# Patient Record
Sex: Female | Born: 1960 | Race: White | Hispanic: No | Marital: Married | Smoking: Former smoker
Health system: Southern US, Community
[De-identification: ages and names within clinical notes are randomized; demographics above are authoritative.]

## PROBLEM LIST (undated history)

## (undated) DIAGNOSIS — R7303 Prediabetes: Secondary | ICD-10-CM

## (undated) DIAGNOSIS — Z960 Presence of urogenital implants: Secondary | ICD-10-CM

## (undated) DIAGNOSIS — I8393 Asymptomatic varicose veins of bilateral lower extremities: Secondary | ICD-10-CM

## (undated) DIAGNOSIS — F411 Generalized anxiety disorder: Secondary | ICD-10-CM

## (undated) DIAGNOSIS — J439 Emphysema, unspecified: Secondary | ICD-10-CM

## (undated) DIAGNOSIS — N812 Incomplete uterovaginal prolapse: Secondary | ICD-10-CM

## (undated) DIAGNOSIS — Z860101 Personal history of adenomatous and serrated colon polyps: Secondary | ICD-10-CM

## (undated) DIAGNOSIS — N393 Stress incontinence (female) (male): Secondary | ICD-10-CM

## (undated) DIAGNOSIS — N814 Uterovaginal prolapse, unspecified: Secondary | ICD-10-CM

## (undated) DIAGNOSIS — D259 Leiomyoma of uterus, unspecified: Secondary | ICD-10-CM

## (undated) DIAGNOSIS — H409 Unspecified glaucoma: Secondary | ICD-10-CM

## (undated) DIAGNOSIS — Z972 Presence of dental prosthetic device (complete) (partial): Secondary | ICD-10-CM

## (undated) DIAGNOSIS — F419 Anxiety disorder, unspecified: Secondary | ICD-10-CM

## (undated) DIAGNOSIS — N3281 Overactive bladder: Secondary | ICD-10-CM

## (undated) DIAGNOSIS — E0590 Thyrotoxicosis, unspecified without thyrotoxic crisis or storm: Secondary | ICD-10-CM

## (undated) DIAGNOSIS — M1990 Unspecified osteoarthritis, unspecified site: Secondary | ICD-10-CM

## (undated) DIAGNOSIS — K08109 Complete loss of teeth, unspecified cause, unspecified class: Secondary | ICD-10-CM

## (undated) DIAGNOSIS — K219 Gastro-esophageal reflux disease without esophagitis: Secondary | ICD-10-CM

## (undated) DIAGNOSIS — E041 Nontoxic single thyroid nodule: Secondary | ICD-10-CM

## (undated) DIAGNOSIS — L409 Psoriasis, unspecified: Secondary | ICD-10-CM

## (undated) DIAGNOSIS — I10 Essential (primary) hypertension: Secondary | ICD-10-CM

## (undated) DIAGNOSIS — K5909 Other constipation: Secondary | ICD-10-CM

## (undated) HISTORY — PX: DILATION AND CURETTAGE OF UTERUS: SHX78

## (undated) HISTORY — PX: TUBAL LIGATION: SHX77

## (undated) HISTORY — DX: Hypercalcemia: E83.52

## (undated) HISTORY — PX: DIAGNOSTIC LAPAROSCOPY: SUR761

## (undated) HISTORY — DX: Anxiety disorder, unspecified: F41.9

## (undated) HISTORY — PX: HYSTEROSCOPY WITH D & C: SHX1775

## (undated) HISTORY — DX: Nontoxic single thyroid nodule: E04.1

## (undated) HISTORY — PX: SACROCOCCYGEAL ULCER REMOVAL: SHX2369

## (undated) HISTORY — DX: Essential (primary) hypertension: I10

## (undated) HISTORY — PX: OTHER SURGICAL HISTORY: SHX169

---

## 1982-06-26 HISTORY — PX: TUBAL LIGATION: SHX77

## 2004-08-10 ENCOUNTER — Ambulatory Visit (HOSPITAL_COMMUNITY): Admission: RE | Admit: 2004-08-10 | Discharge: 2004-08-10 | Payer: Self-pay | Admitting: Family Medicine

## 2005-10-31 ENCOUNTER — Ambulatory Visit (HOSPITAL_COMMUNITY): Admission: RE | Admit: 2005-10-31 | Discharge: 2005-10-31 | Payer: Self-pay | Admitting: Family Medicine

## 2006-04-09 ENCOUNTER — Ambulatory Visit (HOSPITAL_COMMUNITY): Admission: RE | Admit: 2006-04-09 | Discharge: 2006-04-09 | Payer: Self-pay | Admitting: Family Medicine

## 2008-06-26 DIAGNOSIS — E0590 Thyrotoxicosis, unspecified without thyrotoxic crisis or storm: Secondary | ICD-10-CM

## 2008-06-26 HISTORY — DX: Thyrotoxicosis, unspecified without thyrotoxic crisis or storm: E05.90

## 2009-04-22 ENCOUNTER — Ambulatory Visit: Payer: Self-pay | Admitting: Internal Medicine

## 2009-12-28 ENCOUNTER — Other Ambulatory Visit: Admission: RE | Admit: 2009-12-28 | Discharge: 2009-12-28 | Payer: Self-pay | Admitting: Obstetrics & Gynecology

## 2010-01-03 ENCOUNTER — Ambulatory Visit (HOSPITAL_COMMUNITY): Admission: RE | Admit: 2010-01-03 | Discharge: 2010-01-03 | Payer: Self-pay | Admitting: Obstetrics and Gynecology

## 2010-04-04 ENCOUNTER — Ambulatory Visit (HOSPITAL_COMMUNITY): Admission: RE | Admit: 2010-04-04 | Discharge: 2010-04-04 | Payer: Self-pay | Admitting: Obstetrics & Gynecology

## 2011-01-06 ENCOUNTER — Other Ambulatory Visit: Payer: Self-pay | Admitting: Obstetrics & Gynecology

## 2011-01-06 DIAGNOSIS — Z139 Encounter for screening, unspecified: Secondary | ICD-10-CM

## 2011-02-13 ENCOUNTER — Other Ambulatory Visit (HOSPITAL_COMMUNITY)
Admission: RE | Admit: 2011-02-13 | Discharge: 2011-02-13 | Disposition: A | Discharge status: Home / Self Care | Payer: 59 | Source: Ambulatory Visit | Attending: Obstetrics & Gynecology | Admitting: Obstetrics & Gynecology

## 2011-02-13 ENCOUNTER — Ambulatory Visit (HOSPITAL_COMMUNITY)
Admission: RE | Admit: 2011-02-13 | Discharge: 2011-02-13 | Disposition: A | Discharge status: Home / Self Care | Payer: 59 | Source: Ambulatory Visit | Attending: Obstetrics & Gynecology | Admitting: Obstetrics & Gynecology

## 2011-02-13 ENCOUNTER — Other Ambulatory Visit: Payer: Self-pay | Admitting: Obstetrics & Gynecology

## 2011-02-13 DIAGNOSIS — Z01419 Encounter for gynecological examination (general) (routine) without abnormal findings: Secondary | ICD-10-CM | POA: Insufficient documentation

## 2011-02-13 DIAGNOSIS — Z1231 Encounter for screening mammogram for malignant neoplasm of breast: Secondary | ICD-10-CM | POA: Insufficient documentation

## 2011-02-13 DIAGNOSIS — Z139 Encounter for screening, unspecified: Secondary | ICD-10-CM

## 2012-02-27 ENCOUNTER — Other Ambulatory Visit: Payer: Self-pay | Admitting: Obstetrics & Gynecology

## 2012-02-27 DIAGNOSIS — Z139 Encounter for screening, unspecified: Secondary | ICD-10-CM

## 2012-03-11 ENCOUNTER — Ambulatory Visit (HOSPITAL_COMMUNITY)
Admission: RE | Admit: 2012-03-11 | Discharge: 2012-03-11 | Disposition: A | Discharge status: Home / Self Care | Payer: 59 | Source: Ambulatory Visit | Attending: Obstetrics & Gynecology | Admitting: Obstetrics & Gynecology

## 2012-03-11 DIAGNOSIS — Z1231 Encounter for screening mammogram for malignant neoplasm of breast: Secondary | ICD-10-CM | POA: Insufficient documentation

## 2012-03-11 DIAGNOSIS — Z139 Encounter for screening, unspecified: Secondary | ICD-10-CM

## 2012-03-25 ENCOUNTER — Other Ambulatory Visit: Payer: Self-pay | Admitting: Obstetrics & Gynecology

## 2012-03-25 ENCOUNTER — Other Ambulatory Visit (HOSPITAL_COMMUNITY)
Admission: RE | Admit: 2012-03-25 | Discharge: 2012-03-25 | Disposition: A | Discharge status: Home / Self Care | Payer: 59 | Source: Ambulatory Visit | Attending: Obstetrics & Gynecology | Admitting: Obstetrics & Gynecology

## 2012-03-25 DIAGNOSIS — Z01419 Encounter for gynecological examination (general) (routine) without abnormal findings: Secondary | ICD-10-CM | POA: Insufficient documentation

## 2013-02-04 ENCOUNTER — Other Ambulatory Visit: Payer: Self-pay | Admitting: Obstetrics & Gynecology

## 2013-02-04 DIAGNOSIS — Z139 Encounter for screening, unspecified: Secondary | ICD-10-CM

## 2013-03-31 ENCOUNTER — Other Ambulatory Visit (HOSPITAL_COMMUNITY)
Admission: RE | Admit: 2013-03-31 | Discharge: 2013-03-31 | Disposition: A | Discharge status: Home / Self Care | Payer: 59 | Source: Ambulatory Visit | Attending: Obstetrics & Gynecology | Admitting: Obstetrics & Gynecology

## 2013-03-31 ENCOUNTER — Ambulatory Visit (INDEPENDENT_AMBULATORY_CARE_PROVIDER_SITE_OTHER): Payer: 59 | Admitting: Obstetrics & Gynecology

## 2013-03-31 ENCOUNTER — Ambulatory Visit (HOSPITAL_COMMUNITY)
Admission: RE | Admit: 2013-03-31 | Discharge: 2013-03-31 | Disposition: A | Discharge status: Home / Self Care | Payer: 59 | Source: Ambulatory Visit | Attending: Obstetrics & Gynecology | Admitting: Obstetrics & Gynecology

## 2013-03-31 ENCOUNTER — Encounter: Payer: Self-pay | Admitting: Obstetrics & Gynecology

## 2013-03-31 VITALS — BP 100/80 | Ht 64.2 in | Wt 139.0 lb

## 2013-03-31 DIAGNOSIS — E7800 Pure hypercholesterolemia, unspecified: Secondary | ICD-10-CM

## 2013-03-31 DIAGNOSIS — Z139 Encounter for screening, unspecified: Secondary | ICD-10-CM

## 2013-03-31 DIAGNOSIS — Z1231 Encounter for screening mammogram for malignant neoplasm of breast: Secondary | ICD-10-CM | POA: Insufficient documentation

## 2013-03-31 DIAGNOSIS — E039 Hypothyroidism, unspecified: Secondary | ICD-10-CM

## 2013-03-31 DIAGNOSIS — Z1212 Encounter for screening for malignant neoplasm of rectum: Secondary | ICD-10-CM

## 2013-03-31 DIAGNOSIS — E559 Vitamin D deficiency, unspecified: Secondary | ICD-10-CM

## 2013-03-31 DIAGNOSIS — Z01419 Encounter for gynecological examination (general) (routine) without abnormal findings: Secondary | ICD-10-CM

## 2013-03-31 DIAGNOSIS — Z1151 Encounter for screening for human papillomavirus (HPV): Secondary | ICD-10-CM | POA: Insufficient documentation

## 2013-03-31 LAB — LIPID PANEL
Cholesterol: 205 mg/dL — ABNORMAL HIGH (ref 0–200)
HDL: 67 mg/dL (ref >39)
Total CHOL/HDL Ratio: 3.1 Ratio
Triglycerides: 76 mg/dL (ref <150)

## 2013-03-31 NOTE — Progress Notes (Signed)
Patient ID: Dana Dickerson, female   DOB: 1960/07/13, 52 y.o.   MRN: [id number] Subjective:     CHARNESE Dickerson is a 52 y.o. female here for a routine exam.  No LMP recorded. Patient is postmenopausal. No obstetric history on file. Current complaints: none.    Gynecologic History No LMP recorded. Patient is postmenopausal. Contraception: post menopausal status  2007 Last Pap: 2013. Results were: normal Last mammogram: 2014. Results were: normal  History reviewed. No pertinent past medical history.  History reviewed. No pertinent past surgical history.  OB History   Grav Para Term Preterm Abortions TAB SAB Ect Mult Living                  History   Social History  . Marital Status: Married    Spouse Name: N/A    Number of Children: N/A  . Years of Education: N/A   Social History Main Topics  . Smoking status: Former Games developer  . Smokeless tobacco: None  . Alcohol Use: None  . Drug Use: None  . Sexual Activity: None   Other Topics Concern  . None   Social History Narrative  . None    Family History  Problem Relation Age of Onset  . Stroke Other      Review of Systems  Review of Systems  Constitutional: Negative for fever, chills, weight loss, malaise/fatigue and diaphoresis.  HENT: Negative for hearing loss, ear pain, nosebleeds, congestion, sore throat, neck pain, tinnitus and ear discharge.   Eyes: Negative for blurred vision, double vision, photophobia, pain, discharge and redness.  Respiratory: Negative for cough, hemoptysis, sputum production, shortness of breath, wheezing and stridor.   Cardiovascular: Negative for chest pain, palpitations, orthopnea, claudication, leg swelling and PND.  Gastrointestinal: negative for abdominal pain. Negative for heartburn, nausea, vomiting, diarrhea, constipation, blood in stool and melena.  Genitourinary: Negative for dysuria, urgency, frequency, hematuria and flank pain.  Musculoskeletal: Negative for myalgias, back pain,  joint pain and falls.  Skin: Negative for itching and rash.  Neurological: Negative for dizziness, tingling, tremors, sensory change, speech change, focal weakness, seizures, loss of consciousness, weakness and headaches.  Endo/Heme/Allergies: Negative for environmental allergies and polydipsia. Does not bruise/bleed easily.  Psychiatric/Behavioral: Negative for depression, suicidal ideas, hallucinations, memory loss and substance abuse. The patient is not nervous/anxious and does not have insomnia.        Objective:    Physical Exam  Vitals reviewed. Constitutional: She is oriented to person, place, and time. She appears well-developed and well-nourished.  HENT:  Head: Normocephalic and atraumatic.        Right Ear: External ear normal.  Left Ear: External ear normal.  Nose: Nose normal.  Mouth/Throat: Oropharynx is clear and moist.  Eyes: Conjunctivae and EOM are normal. Pupils are equal, round, and reactive to light. Right eye exhibits no discharge. Left eye exhibits no discharge. No scleral icterus.  Neck: Normal range of motion. Neck supple. No tracheal deviation present. No thyromegaly present.  Cardiovascular: Normal rate, regular rhythm, normal heart sounds and intact distal pulses.  Exam reveals no gallop and no friction rub.   No murmur heard. Respiratory: Effort normal and breath sounds normal. No respiratory distress. She has no wheezes. She has no rales. She exhibits no tenderness.  GI: Soft. Bowel sounds are normal. She exhibits no distension and no mass. There is no tenderness. There is no rebound and no guarding.  Genitourinary:  Breasts no masses skin changes or nipple changes bilaterally  Vulva is normal without lesions Vagina is pink moist without discharge Cervix normal in appearance and pap is done Uterus is normal size shape and contour Adnexa is negative with normal sized ovaries  Rectal    hemoccult negative, normal tone, no masses  Musculoskeletal: Normal  range of motion. She exhibits no edema and no tenderness.  Neurological: She is alert and oriented to person, place, and time. She has normal reflexes. She displays normal reflexes. No cranial nerve deficit. She exhibits normal muscle tone. Coordination normal.  Skin: Skin is warm and dry. No rash noted. No erythema. No pallor.  Psychiatric: She has a normal mood and affect. Her behavior is normal. Judgment and thought content normal.       Assessment:    Healthy female exam.    Plan:    Follow up in: 1 year. labs ordered

## 2013-03-31 NOTE — Addendum Note (Signed)
Addended by: Richardson Chiquito on: 03/31/2013 09:48 AM   Modules accepted: Orders

## 2014-02-25 ENCOUNTER — Other Ambulatory Visit: Payer: Self-pay | Admitting: Obstetrics & Gynecology

## 2014-02-25 DIAGNOSIS — Z1231 Encounter for screening mammogram for malignant neoplasm of breast: Secondary | ICD-10-CM

## 2014-04-06 ENCOUNTER — Ambulatory Visit (HOSPITAL_COMMUNITY)
Admission: RE | Admit: 2014-04-06 | Discharge: 2014-04-06 | Disposition: A | Discharge status: Home / Self Care | Payer: 59 | Source: Ambulatory Visit | Attending: Obstetrics & Gynecology | Admitting: Obstetrics & Gynecology

## 2014-04-06 ENCOUNTER — Other Ambulatory Visit: Payer: 59 | Admitting: Obstetrics & Gynecology

## 2014-04-06 ENCOUNTER — Ambulatory Visit (HOSPITAL_COMMUNITY): Payer: 59

## 2014-04-06 DIAGNOSIS — Z1231 Encounter for screening mammogram for malignant neoplasm of breast: Secondary | ICD-10-CM | POA: Diagnosis present

## 2014-04-13 ENCOUNTER — Ambulatory Visit (INDEPENDENT_AMBULATORY_CARE_PROVIDER_SITE_OTHER): Payer: 59 | Admitting: Obstetrics & Gynecology

## 2014-04-13 ENCOUNTER — Encounter: Payer: Self-pay | Admitting: Obstetrics & Gynecology

## 2014-04-13 ENCOUNTER — Other Ambulatory Visit (HOSPITAL_COMMUNITY)
Admission: RE | Admit: 2014-04-13 | Discharge: 2014-04-13 | Disposition: A | Discharge status: Home / Self Care | Payer: 59 | Source: Ambulatory Visit | Attending: Obstetrics & Gynecology | Admitting: Obstetrics & Gynecology

## 2014-04-13 VITALS — BP 120/80 | Ht 64.0 in | Wt 167.0 lb

## 2014-04-13 DIAGNOSIS — Z01419 Encounter for gynecological examination (general) (routine) without abnormal findings: Secondary | ICD-10-CM | POA: Diagnosis present

## 2014-04-13 DIAGNOSIS — Z1212 Encounter for screening for malignant neoplasm of rectum: Secondary | ICD-10-CM

## 2014-04-13 DIAGNOSIS — R7989 Other specified abnormal findings of blood chemistry: Secondary | ICD-10-CM

## 2014-04-13 DIAGNOSIS — R739 Hyperglycemia, unspecified: Secondary | ICD-10-CM

## 2014-04-13 DIAGNOSIS — E038 Other specified hypothyroidism: Secondary | ICD-10-CM

## 2014-04-13 DIAGNOSIS — E7800 Pure hypercholesterolemia, unspecified: Secondary | ICD-10-CM

## 2014-04-13 DIAGNOSIS — Z1211 Encounter for screening for malignant neoplasm of colon: Secondary | ICD-10-CM

## 2014-04-13 LAB — HEMOGLOBIN A1C
HEMOGLOBIN A1C: 6.1 % — AB (ref <5.7)
MEAN PLASMA GLUCOSE: 128 mg/dL — AB (ref <117)

## 2014-04-13 LAB — LIPID PANEL
Cholesterol: 184 mg/dL (ref 0–200)
HDL: 60 mg/dL (ref >39)
LDL CALC: 107 mg/dL — AB (ref 0–99)
Total CHOL/HDL Ratio: 3.1 Ratio
Triglycerides: 86 mg/dL (ref <150)
VLDL: 17 mg/dL (ref 0–40)

## 2014-04-13 LAB — TSH: TSH: 0.191 u[IU]/mL — ABNORMAL LOW (ref 0.350–4.500)

## 2014-04-13 NOTE — Progress Notes (Signed)
Patient ID: Dana Dickerson, female   DOB: Sep 29, 1960, 53 y.o.   MRN: [id number] Subjective:     Dana Dickerson is a 53 y.o. female here for a routine exam.  No LMP recorded. Patient is postmenopausal. No obstetric history on file. Birth Control Method:  na Menstrual Calendar(currently): none  Current complaints: none.   Current acute medical issues:  none   Recent Gynecologic History No LMP recorded. Patient is postmenopausal. Last Pap: 2014,  normal Last mammogram: 2015,  normal  History reviewed. No pertinent past medical history.  History reviewed. No pertinent past surgical history.  OB History   Grav Para Term Preterm Abortions TAB SAB Ect Mult Living                  History   Social History  . Marital Status: Married    Spouse Name: N/A    Number of Children: N/A  . Years of Education: N/A   Social History Main Topics  . Smoking status: Former Smoker    Quit date: 01/29/2013  . Smokeless tobacco: None  . Alcohol Use: None  . Drug Use: None  . Sexual Activity: None   Other Topics Concern  . None   Social History Narrative  . None    Family History  Problem Relation Age of Onset  . Stroke Other   . Aneurysm Maternal Grandfather     Current outpatient prescriptions:Multiple Vitamin (MULTIVITAMIN) tablet, Take 1 tablet by mouth daily., Disp: , Rfl: ;  sertraline (ZOLOFT) 50 MG tablet, Take 50 mg by mouth daily., Disp: , Rfl: ;  varenicline (CHANTIX) 0.5 MG tablet, Take 0.5 mg by mouth 2 (two) times daily., Disp: , Rfl:   Review of Systems  Review of Systems  Constitutional: Negative for fever, chills, weight loss, malaise/fatigue and diaphoresis.  HENT: Negative for hearing loss, ear pain, nosebleeds, congestion, sore throat, neck pain, tinnitus and ear discharge.   Eyes: Negative for blurred vision, double vision, photophobia, pain, discharge and redness.  Respiratory: Negative for cough, hemoptysis, sputum production, shortness of breath, wheezing and  stridor.   Cardiovascular: Negative for chest pain, palpitations, orthopnea, claudication, leg swelling and PND.  Gastrointestinal: negative for abdominal pain. Negative for heartburn, nausea, vomiting, diarrhea, constipation, blood in stool and melena.  Genitourinary: Negative for dysuria, urgency, frequency, hematuria and flank pain.  Musculoskeletal: Negative for myalgias, back pain, joint pain and falls.  Skin: Negative for itching and rash.  Neurological: Negative for dizziness, tingling, tremors, sensory change, speech change, focal weakness, seizures, loss of consciousness, weakness and headaches.  Endo/Heme/Allergies: Negative for environmental allergies and polydipsia. Does not bruise/bleed easily.  Psychiatric/Behavioral: Negative for depression, suicidal ideas, hallucinations, memory loss and substance abuse. The patient is not nervous/anxious and does not have insomnia.        Objective:  Blood pressure 120/80, height 5\' 4"  (1.626 m), weight 167 lb (75.751 kg).   Physical Exam  Vitals reviewed. Constitutional: She is oriented to person, place, and time. She appears well-developed and well-nourished.  HENT:  Head: Normocephalic and atraumatic.        Right Ear: External ear normal.  Left Ear: External ear normal.  Nose: Nose normal.  Mouth/Throat: Oropharynx is clear and moist.  Eyes: Conjunctivae and EOM are normal. Pupils are equal, round, and reactive to light. Right eye exhibits no discharge. Left eye exhibits no discharge. No scleral icterus.  Neck: Normal range of motion. Neck supple. No tracheal deviation present. No thyromegaly present.  Cardiovascular:  Normal rate, regular rhythm, normal heart sounds and intact distal pulses.  Exam reveals no gallop and no friction rub.   No murmur heard. Respiratory: Effort normal and breath sounds normal. No respiratory distress. She has no wheezes. She has no rales. She exhibits no tenderness.  GI: Soft. Bowel sounds are normal. She  exhibits no distension and no mass. There is no tenderness. There is no rebound and no guarding.  Genitourinary:  Breasts no masses skin changes or nipple changes bilaterally      Vulva is normal without lesions Vagina is pink moist without discharge Cervix normal in appearance and pap is done Uterus is normal size shape and contour Adnexa is negative with normal sized ovaries  Rectal    hemoccult negative, normal tone, no masses  Musculoskeletal: Normal range of motion. She exhibits no edema and no tenderness.  Neurological: She is alert and oriented to person, place, and time. She has normal reflexes. She displays normal reflexes. No cranial nerve deficit. She exhibits normal muscle tone. Coordination normal.  Skin: Skin is warm and dry. No rash noted. No erythema. No pallor.  Psychiatric: She has a normal mood and affect. Her behavior is normal. Judgment and thought content normal.       Assessment:    Healthy female exam.    Plan:    Follow up in: 1 year.

## 2014-04-14 LAB — CYTOLOGY - PAP

## 2014-04-14 LAB — VITAMIN D 25 HYDROXY (VIT D DEFICIENCY, FRACTURES): VIT D 25 HYDROXY: 43 ng/mL (ref 30–89)

## 2015-03-04 ENCOUNTER — Other Ambulatory Visit: Payer: Self-pay | Admitting: Obstetrics & Gynecology

## 2015-03-04 DIAGNOSIS — Z1231 Encounter for screening mammogram for malignant neoplasm of breast: Secondary | ICD-10-CM

## 2015-04-19 ENCOUNTER — Ambulatory Visit (HOSPITAL_COMMUNITY): Payer: Self-pay

## 2015-04-19 ENCOUNTER — Other Ambulatory Visit: Payer: Self-pay | Admitting: Obstetrics & Gynecology

## 2015-04-20 ENCOUNTER — Other Ambulatory Visit: Payer: Self-pay | Admitting: Obstetrics & Gynecology

## 2015-04-21 ENCOUNTER — Encounter: Payer: Self-pay | Admitting: Women's Health

## 2015-04-21 ENCOUNTER — Other Ambulatory Visit (HOSPITAL_COMMUNITY)
Admission: RE | Admit: 2015-04-21 | Discharge: 2015-04-21 | Disposition: A | Discharge status: Home / Self Care | Payer: 59 | Source: Ambulatory Visit | Attending: Obstetrics & Gynecology | Admitting: Obstetrics & Gynecology

## 2015-04-21 ENCOUNTER — Ambulatory Visit (HOSPITAL_COMMUNITY)
Admission: RE | Admit: 2015-04-21 | Discharge: 2015-04-21 | Disposition: A | Discharge status: Home / Self Care | Payer: 59 | Source: Ambulatory Visit | Attending: Obstetrics & Gynecology | Admitting: Obstetrics & Gynecology

## 2015-04-21 ENCOUNTER — Ambulatory Visit (INDEPENDENT_AMBULATORY_CARE_PROVIDER_SITE_OTHER): Payer: 59 | Admitting: Women's Health

## 2015-04-21 VITALS — BP 128/82 | HR 64 | Ht 64.5 in | Wt 179.0 lb

## 2015-04-21 DIAGNOSIS — IMO0002 Reserved for concepts with insufficient information to code with codable children: Secondary | ICD-10-CM

## 2015-04-21 DIAGNOSIS — Z1151 Encounter for screening for human papillomavirus (HPV): Secondary | ICD-10-CM | POA: Diagnosis not present

## 2015-04-21 DIAGNOSIS — Z01419 Encounter for gynecological examination (general) (routine) without abnormal findings: Secondary | ICD-10-CM | POA: Diagnosis not present

## 2015-04-21 DIAGNOSIS — Z1231 Encounter for screening mammogram for malignant neoplasm of breast: Secondary | ICD-10-CM | POA: Diagnosis present

## 2015-04-21 DIAGNOSIS — IMO0001 Reserved for inherently not codable concepts without codable children: Secondary | ICD-10-CM | POA: Insufficient documentation

## 2015-04-21 DIAGNOSIS — K5900 Constipation, unspecified: Secondary | ICD-10-CM

## 2015-04-21 NOTE — Progress Notes (Signed)
Patient ID: Dana Dickerson, female   DOB: [birth year], 54 y.o.   MRN: [id number] Subjective:   Dana Dickerson is a 54 y.o. [phone] Caucasian female here for a routine well-woman exam.  No LMP recorded. Patient is postmenopausal.    Current complaints: constipation x 1 week, used to be constipated when smoked but has been 'regular' since quit smoking x 3 years- no bleeding/hemorrhoids. Hasn't ever had colonoscopy.  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  Past Medical History History reviewed. No pertinent past medical history.  Past Surgical History History reviewed. No pertinent past surgical history.  Gynecologic History No obstetric history on file.  No LMP recorded. Patient is postmenopausal. Contraception: post menopausal status Last Pap: 2015. Results were: normal, no hpv cotesting- wants another pap this year Last mammogram: this morning. Results were: not in yet- reports all mammograms have been normal in past Last TCS: never  Obstetric History OB History  No data available    Current Medications Current Outpatient Prescriptions on File Prior to Visit  Medication Sig Dispense Refill  . Multiple Vitamin (MULTIVITAMIN) tablet Take 1 tablet by mouth daily.    . sertraline (ZOLOFT) 50 MG tablet Take 75 mg by mouth daily. 75mg     . varenicline (CHANTIX) 0.5 MG tablet Take 0.5 mg by mouth 2 (two) times daily.     No current facility-administered  medications on file prior to visit.    Review of Systems Patient denies any headaches, blurred vision, shortness of breath, chest pain, abdominal pain, problems with bowel movements, urination, or intercourse.  Objective:  BP 128/82 mmHg  Pulse 64  Ht 5' 4.5" (1.638 m)  Wt 179 lb (81.194 kg)  BMI 30.26 kg/m2 Physical Exam  General:  Well developed, well nourished, no acute distress. She is alert and oriented x3. Skin:  Warm and dry Neck:  Midline trachea, no thyromegaly or nodules Cardiovascular: Regular rate and rhythm, no murmur heard Lungs:  Effort normal, all lung fields clear to auscultation bilaterally Breasts:  No dominant palpable mass, retraction, or nipple discharge Abdomen:  Soft, non tender, no hepatosplenomegaly or masses Pelvic:  External genitalia is normal in appearance.  The vagina is normal in appearance. The cervix is bulbous, no CMT.  Thin prep pap is done w/ HR HPV cotesting. Uterus is felt to be normal size, shape, and contour.  No adnexal masses or tenderness noted. Grade 3 cystocele- states she isn't able to hold urine as long anymore and occ leaks, but this is not something that bothers her and something she has discussed w/ [facility] in past- not ready for surgical intervention Rectal: Good sphincter tone, no polyps, or hemorrhoids felt.  Extremities:  No swelling or varicosities noted Psych:  She has a normal mood and affect  Assessment:   Healthy well-woman exam BMI 30 Grade 3 cystocele Constipation Needs screening TCS  Plan:  Gave printed info on constipation relief measures Schedule TCS asap (has never had)- gave numbers to local GI MDs CBC, CMP, TSH, A1C, Lipid panel today  F/U 30yr for physical, or sooner if needed Mammogram q24yr  Colonoscopy: screening asap  Declined request for weight loss meds- recommended exercise daily (walking/swimming), decrease carbs  Tawnya Crook CNM, [facility] 04/21/2015 10:44 AM

## 2015-04-21 NOTE — Patient Instructions (Signed)
Schedule a colonoscopy as soon as possible  Dr. Dereck Leep [phone]  Dr. Gala Romney or Dr. Oneida Alar [phone]  Constipation  Drink plenty of fluid, preferably water, throughout the day  Eat foods high in fiber such as fruits, vegetables, and grains  Exercise, such as walking, is a good way to keep your bowels regular  Drink warm fluids, especially warm prune juice, or decaf coffee  Eat a 1/2 cup of real oatmeal (not instant), 1/2 cup applesauce, and 1/2-1 cup warm prune juice every day  If needed, you may take Colace (docusate sodium) stool softener once or twice a day to help keep the stool soft.   If you still are having problems with constipation, you may take Miralax once daily as needed to help keep your bowels regular.   Begin exercising, walk daily, decrease carbohydrates (bread, pasta, rice, potatoes, sweets) to help you lose weight

## 2015-04-22 LAB — CBC
HEMATOCRIT: 42.4 % (ref 34.0–46.6)
Hemoglobin: 14.7 g/dL (ref 11.1–15.9)
MCH: 31.6 pg (ref 26.6–33.0)
MCHC: 34.7 g/dL (ref 31.5–35.7)
MCV: 91 fL (ref 79–97)
Platelets: 303 10*3/uL (ref 150–379)
RBC: 4.65 x10E6/uL (ref 3.77–5.28)
RDW: 14.2 % (ref 12.3–15.4)
WBC: 8.4 10*3/uL (ref 3.4–10.8)

## 2015-04-22 LAB — COMPREHENSIVE METABOLIC PANEL
A/G RATIO: 2 (ref 1.1–2.5)
ALBUMIN: 4.4 g/dL (ref 3.5–5.5)
ALT: 30 IU/L (ref 0–32)
AST: 22 IU/L (ref 0–40)
Alkaline Phosphatase: 44 IU/L (ref 39–117)
BUN / CREAT RATIO: 19 (ref 9–23)
BUN: 13 mg/dL (ref 6–24)
Bilirubin Total: 0.3 mg/dL (ref 0.0–1.2)
CALCIUM: 10 mg/dL (ref 8.7–10.2)
CO2: 25 mmol/L (ref 18–29)
Chloride: 103 mmol/L (ref 97–106)
Creatinine, Ser: 0.67 mg/dL (ref 0.57–1.00)
GFR, EST AFRICAN AMERICAN: 114 mL/min/{1.73_m2} (ref >59)
GFR, EST NON AFRICAN AMERICAN: 99 mL/min/{1.73_m2} (ref >59)
GLOBULIN, TOTAL: 2.2 g/dL (ref 1.5–4.5)
Glucose: 91 mg/dL (ref 65–99)
POTASSIUM: 4.5 mmol/L (ref 3.5–5.2)
SODIUM: 143 mmol/L (ref 136–144)
Total Protein: 6.6 g/dL (ref 6.0–8.5)

## 2015-04-22 LAB — LIPID PANEL
CHOL/HDL RATIO: 3.6 ratio (ref 0.0–4.4)
CHOLESTEROL TOTAL: 201 mg/dL — AB (ref 100–199)
HDL: 56 mg/dL (ref >39)
LDL CALC: 124 mg/dL — AB (ref 0–99)
Triglycerides: 105 mg/dL (ref 0–149)
VLDL Cholesterol Cal: 21 mg/dL (ref 5–40)

## 2015-04-22 LAB — CYTOLOGY - PAP

## 2015-04-22 LAB — TSH: TSH: 1.1 u[IU]/mL (ref 0.450–4.500)

## 2015-04-26 ENCOUNTER — Telehealth: Payer: Self-pay | Admitting: Women's Health

## 2015-04-26 NOTE — Telephone Encounter (Signed)
Calling to notify pt of labs. VM box not set up on 1st number, 2nd number disconnected. Will continue trying.  Roma Schanz, CNM, [facility] 04/26/2015 2:21 PM

## 2015-04-28 ENCOUNTER — Telehealth: Payer: Self-pay | Admitting: Women's Health

## 2015-04-28 NOTE — Telephone Encounter (Signed)
Pt informed of Normal pap results from 04/21/2015, Lipid slightly elevated, pt encouraged to lose weight, decrease carbs, exercise per Knute Neu, CNM. Pt did not have HGB A1C results will call [facility] and see if can be added. Pt will be notified if repeat blood draw needed.   Called [facility] they will added A1C to blood work.

## 2015-05-03 ENCOUNTER — Telehealth: Payer: Self-pay | Admitting: *Deleted

## 2015-05-03 ENCOUNTER — Telehealth: Payer: Self-pay | Admitting: Women's Health

## 2015-05-03 NOTE — Telephone Encounter (Signed)
LM for pt to return call, need to notify of labs.  Roma Schanz, CNM, [facility] 05/03/2015 12:01 PM

## 2015-05-03 NOTE — Telephone Encounter (Signed)
Returning pt's call. LM for her to call back.  Roma Schanz, CNM, [facility] 05/03/2015 1:43 PM

## 2015-05-03 NOTE — Telephone Encounter (Signed)
Pt returned call, notified her of all labs, A1C 6.0 down from 6.1 last year- to try to get below 5.7. Cholesterol slightly elevated. Recommended weight loss, increasing exercise, cutting back on red meats/carbs.  Roma Schanz, CNM, [facility] 05/03/2015 1:46 PM

## 2015-05-12 LAB — SPECIMEN STATUS REPORT

## 2015-05-13 LAB — HGB A1C W/O EAG: Hgb A1c MFr Bld: 6 % — ABNORMAL HIGH (ref 4.8–5.6)

## 2015-05-13 LAB — SPECIMEN STATUS REPORT

## 2016-03-01 ENCOUNTER — Telehealth: Payer: Self-pay | Admitting: *Deleted

## 2016-03-01 NOTE — Telephone Encounter (Signed)
Received referral for low dose lung cancer screening CT scan. Voicemail left at phone number listed in EMR for patient to call me back to facilitate scheduling scan.  

## 2016-03-07 ENCOUNTER — Telehealth: Payer: Self-pay | Admitting: *Deleted

## 2016-03-07 NOTE — Telephone Encounter (Signed)
Received referral for low dose lung cancer screening CT scan. Voicemail left at phone number listed in EMR for patient to call me back to facilitate scheduling scan.  

## 2016-03-08 ENCOUNTER — Telehealth: Payer: Self-pay | Admitting: *Deleted

## 2016-03-08 NOTE — Telephone Encounter (Signed)
Patient returned call and after discussion of lung cancer CT screening, she would like to wait to have screening after Christmas.

## 2016-03-16 DIAGNOSIS — M1811 Unilateral primary osteoarthritis of first carpometacarpal joint, right hand: Secondary | ICD-10-CM | POA: Insufficient documentation

## 2016-03-16 DIAGNOSIS — L409 Psoriasis, unspecified: Secondary | ICD-10-CM | POA: Insufficient documentation

## 2016-03-31 ENCOUNTER — Other Ambulatory Visit: Payer: Self-pay | Admitting: Obstetrics & Gynecology

## 2016-03-31 DIAGNOSIS — Z1231 Encounter for screening mammogram for malignant neoplasm of breast: Secondary | ICD-10-CM

## 2016-04-24 ENCOUNTER — Ambulatory Visit (HOSPITAL_COMMUNITY)
Admission: RE | Admit: 2016-04-24 | Discharge: 2016-04-24 | Disposition: A | Discharge status: Home / Self Care | Payer: 59 | Source: Ambulatory Visit | Attending: Obstetrics & Gynecology | Admitting: Obstetrics & Gynecology

## 2016-04-24 DIAGNOSIS — Z1231 Encounter for screening mammogram for malignant neoplasm of breast: Secondary | ICD-10-CM | POA: Insufficient documentation

## 2016-07-05 ENCOUNTER — Telehealth: Payer: Self-pay | Admitting: *Deleted

## 2016-07-05 NOTE — Telephone Encounter (Signed)
Received referral for low dose lung cancer screening CT scan. Voicemail left at phone number listed in EMR for patient to call me back to facilitate scheduling scan.  

## 2016-07-06 ENCOUNTER — Telehealth: Payer: Self-pay | Admitting: *Deleted

## 2016-07-06 NOTE — Telephone Encounter (Signed)
Received referral for low dose lung cancer screening CT scan. Voicemail left at phone number listed in EMR for patient to call me back to facilitate scheduling scan.  

## 2016-07-14 ENCOUNTER — Other Ambulatory Visit: Payer: Self-pay | Admitting: Family

## 2016-07-14 ENCOUNTER — Ambulatory Visit
Admission: RE | Admit: 2016-07-14 | Discharge: 2016-07-14 | Disposition: A | Discharge status: Home / Self Care | Payer: Worker's Compensation | Source: Ambulatory Visit | Attending: Family | Admitting: Family

## 2016-07-14 DIAGNOSIS — S2020XA Contusion of thorax, unspecified, initial encounter: Secondary | ICD-10-CM | POA: Diagnosis not present

## 2016-07-14 DIAGNOSIS — R52 Pain, unspecified: Secondary | ICD-10-CM

## 2016-07-14 DIAGNOSIS — M25511 Pain in right shoulder: Secondary | ICD-10-CM | POA: Insufficient documentation

## 2016-07-14 DIAGNOSIS — X58XXXA Exposure to other specified factors, initial encounter: Secondary | ICD-10-CM | POA: Diagnosis not present

## 2016-07-14 DIAGNOSIS — S301XXA Contusion of abdominal wall, initial encounter: Secondary | ICD-10-CM | POA: Diagnosis not present

## 2016-08-28 ENCOUNTER — Encounter: Payer: Self-pay | Admitting: *Deleted

## 2016-10-26 ENCOUNTER — Other Ambulatory Visit: Payer: Self-pay | Admitting: *Deleted

## 2016-10-26 ENCOUNTER — Other Ambulatory Visit: Payer: Self-pay | Admitting: Obstetrics and Gynecology

## 2016-10-26 ENCOUNTER — Encounter: Payer: Self-pay | Admitting: Obstetrics and Gynecology

## 2016-10-26 ENCOUNTER — Ambulatory Visit (INDEPENDENT_AMBULATORY_CARE_PROVIDER_SITE_OTHER): Payer: 59 | Admitting: Obstetrics and Gynecology

## 2016-10-26 ENCOUNTER — Telehealth: Payer: Self-pay | Admitting: Obstetrics and Gynecology

## 2016-10-26 VITALS — BP 159/86 | HR 66 | Ht 65.0 in | Wt 189.3 lb

## 2016-10-26 DIAGNOSIS — Z01411 Encounter for gynecological examination (general) (routine) with abnormal findings: Secondary | ICD-10-CM

## 2016-10-26 DIAGNOSIS — E669 Obesity, unspecified: Secondary | ICD-10-CM

## 2016-10-26 DIAGNOSIS — F419 Anxiety disorder, unspecified: Secondary | ICD-10-CM

## 2016-10-26 MED ORDER — IODINE (KELP) 0.15 MG PO TABS: 1.0000 | ORAL_TABLET | Freq: Every day | ORAL | 11 refills | Status: DC

## 2016-10-26 MED ORDER — BUPROPION HCL ER (XL) 150 MG PO TB24: 150.0000 mg | ORAL_TABLET | Freq: Every day | ORAL | 3 refills | Status: DC

## 2016-10-26 NOTE — Patient Instructions (Signed)
  Place annual gynecologic exam patient instructions here.  Thank you for enrolling in [facility]. Please follow the instructions below to securely access your online medical record. MyChart allows you to send messages to your doctor, view your test results, manage appointments, and more.   How Do I Sign Up? 1. In your Internet browser, go to [employer] and enter [email or web address]. 2. Click on the Sign Up Now link in the Sign In box. You will see the New Member Sign Up page. 3. Enter your MyChart Access Code exactly as it appears below. You will not need to use this code after you've completed the sign-up process. If you do not sign up before the expiration date, you must request a new code.  MyChart Access Code: [email or web address] Expires: 12/25/2016  7:53 AM  4. Enter your Social Security Number ([id number]) and Date of Birth (mm/dd/yyyy) as indicated and click Submit. You will be taken to the next sign-up page. 5. Create a MyChart ID. This will be your MyChart login ID and cannot be changed, so think of one that is secure and easy to remember. 6. Create a MyChart password. You can change your password at any time. 7. Enter your Password Reset Question and Answer. This can be used at a later time if you forget your password.  8. Enter your e-mail address. You will receive e-mail notification when new information is available in [facility]. 9. Click Sign Up. You can now view your medical record.   Additional Information Remember, MyChart is NOT to be used for urgent needs. For medical emergencies, dial 911.

## 2016-10-26 NOTE — Telephone Encounter (Signed)
Done-ac 

## 2016-10-26 NOTE — Telephone Encounter (Signed)
Patient called stating the pharmacy did not receive the script for iodine. Can you resend .Please

## 2016-10-26 NOTE — Progress Notes (Signed)
Subjective:   Dana Dickerson is a 56 y.o. G30P3 Caucasian female here for a routine well-woman exam.  No LMP recorded. Patient is postmenopausal.    Current complaints: weight gain since stopping smoking and stopped chantix became very angry- started on zoloft and it is not helping. 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  Past Medical History Past Medical History:  Diagnosis Date  . Anxiety   . Thyroid cyst     Past Surgical History Past Surgical History:  Procedure Laterality Date  . DILATION AND CURETTAGE OF UTERUS      Gynecologic History G3P3  No LMP recorded. Patient is postmenopausal. Contraception: post menopausal status Last Pap: 2016. Results were: normal Last mammogram: 2017. Results were: normal  Obstetric History OB History  Gravida Para Term Preterm AB Living  3 3          SAB TAB Ectopic Multiple Live Births               # Outcome Date GA Lbr Len/2nd Weight Sex Delivery Anes PTL Lv  3 Para 1984    F Vag-Spont     2 Para 1981    M Vag-Spont     1 Para 1979    M Vag-Spont         Current Medications Current Outpatient Prescriptions on File Prior to Visit  Medication Sig Dispense Refill  . Fish Oil OIL by Does not apply route.    . Multiple Vitamin (MULTIVITAMIN) tablet Take 1 tablet by mouth daily.    . Multiple Vitamins-Minerals (HAIR SKIN AND NAILS FORMULA PO) Take by mouth.    . sertraline (ZOLOFT) 50 MG tablet Take 75 mg by mouth daily. 75mg     . IODINE, KELP, PO Take by mouth.     No current  facility-administered medications on file prior to visit.     Review of Systems Patient denies any headaches, blurred vision, shortness of breath, chest pain, abdominal pain, problems with bowel movements, urination, or intercourse.  Objective:  BP (!) 159/86   Pulse 66   Ht 5\' 5"  (1.651 m)   Wt 189 lb 4.8 oz (85.9 kg)   BMI 31.50 kg/m  Physical Exam  General:  Well developed, well nourished, no acute distress. She is alert and oriented x3. Skin:  Warm and dry Neck:  Midline trachea, no thyromegaly or nodules Cardiovascular: Regular rate and rhythm, no murmur heard Lungs:  Effort normal, all lung fields clear to auscultation bilaterally Breasts:  No dominant palpable mass, retraction, or nipple discharge Abdomen:  Soft, non tender, no hepatosplenomegaly or masses Pelvic:  External genitalia is normal in appearance.  The vagina is normal in appearance. The cervix is bulbous, no CMT.  Thin prep pap is not done. Uterus is felt to be normal size, shape, and contour.  No adnexal masses or tenderness noted. Extremities:  No swelling or varicosities noted Psych:  She has a normal mood and affect  Assessment:   Healthy well-woman exam Obesity Anxiety   Plan:  Labs obtained and will follow up accordingly Discussed stopping zoloft and switching to wellbutrin  Stopped tapazole but continue Iodine. F/U 1 year for AE, or sooner if needed Mammogram ordered or sooner if problems Colonoscopy ordered  Melody Rockney Ghee, CNM

## 2016-10-27 ENCOUNTER — Telehealth: Payer: Self-pay | Admitting: Obstetrics and Gynecology

## 2016-10-27 LAB — COMPREHENSIVE METABOLIC PANEL
A/G RATIO: 1.8 (ref 1.2–2.2)
ALK PHOS: 60 IU/L (ref 39–117)
ALT: 26 IU/L (ref 0–32)
AST: 21 IU/L (ref 0–40)
Albumin: 4.3 g/dL (ref 3.5–5.5)
BUN/Creatinine Ratio: 21 (ref 9–23)
BUN: 15 mg/dL (ref 6–24)
Bilirubin Total: <0.2 mg/dL (ref 0.0–1.2)
CALCIUM: 10.7 mg/dL — AB (ref 8.7–10.2)
CO2: 26 mmol/L (ref 18–29)
Chloride: 101 mmol/L (ref 96–106)
Creatinine, Ser: 0.73 mg/dL (ref 0.57–1.00)
GFR calc Af Amer: 106 mL/min/{1.73_m2} (ref >59)
GFR calc non Af Amer: 92 mL/min/{1.73_m2} (ref >59)
GLOBULIN, TOTAL: 2.4 g/dL (ref 1.5–4.5)
Glucose: 80 mg/dL (ref 65–99)
POTASSIUM: 4.5 mmol/L (ref 3.5–5.2)
SODIUM: 143 mmol/L (ref 134–144)
Total Protein: 6.7 g/dL (ref 6.0–8.5)

## 2016-10-27 LAB — THYROID PANEL WITH TSH
Free Thyroxine Index: 1.5 (ref 1.2–4.9)
T3 Uptake Ratio: 22 % — ABNORMAL LOW (ref 24–39)
T4 TOTAL: 6.7 ug/dL (ref 4.5–12.0)
TSH: 2.21 u[IU]/mL (ref 0.450–4.500)

## 2016-10-27 LAB — LIPID PANEL
CHOL/HDL RATIO: 3.9 ratio (ref 0.0–4.4)
Cholesterol, Total: 212 mg/dL — ABNORMAL HIGH (ref 100–199)
HDL: 54 mg/dL (ref >39)
LDL Calculated: 129 mg/dL — ABNORMAL HIGH (ref 0–99)
TRIGLYCERIDES: 143 mg/dL (ref 0–149)
VLDL Cholesterol Cal: 29 mg/dL (ref 5–40)

## 2016-10-27 LAB — HEMOGLOBIN A1C
ESTIMATED AVERAGE GLUCOSE: 120 mg/dL
Hgb A1c MFr Bld: 5.8 % — ABNORMAL HIGH (ref 4.8–5.6)

## 2016-10-27 LAB — VITAMIN D 25 HYDROXY (VIT D DEFICIENCY, FRACTURES): VIT D 25 HYDROXY: 46.8 ng/mL (ref 30.0–100.0)

## 2016-10-27 LAB — CYTOLOGY - PAP

## 2016-10-27 NOTE — Telephone Encounter (Signed)
[facility] doesn't carry the medication [place] you know of a pharmacy she can get it from as a script so that insurance will pay for it  Please call

## 2016-10-31 NOTE — Telephone Encounter (Signed)
Notified pt. 

## 2016-10-31 NOTE — Telephone Encounter (Signed)
-----   Message from Joylene Igo, [place] sent at 10/31/2016  9:30 AM EDT ----- Pap is negative

## 2016-11-03 ENCOUNTER — Telehealth: Payer: Self-pay | Admitting: *Deleted

## 2016-11-03 NOTE — Telephone Encounter (Signed)
Mailed info to pt 

## 2016-11-03 NOTE — Telephone Encounter (Signed)
-----   Message from Joylene Igo, [place] sent at 10/31/2016 12:00 PM EDT ----- Please let her know lab results. They all look better. Keep working on lowering daily intake of high cholesterol foods and sweets.

## 2016-11-13 ENCOUNTER — Other Ambulatory Visit: Payer: Self-pay

## 2016-11-13 ENCOUNTER — Telehealth: Payer: Self-pay | Admitting: Gastroenterology

## 2016-11-13 DIAGNOSIS — Z01411 Encounter for gynecological examination (general) (routine) with abnormal findings: Secondary | ICD-10-CM

## 2016-11-13 NOTE — Telephone Encounter (Signed)
Gastroenterology Pre-Procedure Review  Request Date: 12/15/16 Requesting Physician: Dr. Vicente Males  PATIENT REVIEW QUESTIONS: The patient responded to the following health history questions as indicated:    1. Are you having any GI issues? no 2. Do you have a personal history of Polyps? no 3. Do you have a family history of Colon Cancer or Polyps? no 4. Diabetes Mellitus? no 5. Joint replacements in the past 12 months?no 6. Major health problems in the past 3 months?no 7. Any artificial heart valves, MVP, or defibrillator?no    MEDICATIONS & ALLERGIES:    Patient reports the following regarding taking any anticoagulation/antiplatelet therapy:   Plavix, Coumadin, Eliquis, Xarelto, Lovenox, Pradaxa, Brilinta, or Effient? no Aspirin? no  Patient confirms/reports the following medications:  Current Outpatient Prescriptions  Medication Sig Dispense Refill  . buPROPion (WELLBUTRIN XL) 150 MG 24 hr tablet Take 1 tablet (150 mg total) by mouth daily. 30 tablet 3  . Fish Oil OIL by Does not apply route.    . Iodine, Kelp, 0.15 MG TABS Take 1 tablet (0.15 mg total) by mouth daily. 30 tablet 11  . Multiple Vitamin (MULTIVITAMIN) tablet Take 1 tablet by mouth daily.    . Multiple Vitamins-Minerals (HAIR SKIN AND NAILS FORMULA PO) Take by mouth.     No current facility-administered medications for this visit.     Patient confirms/reports the following allergies:  Allergies  Allergen Reactions  . Codeine Shortness Of Breath  . Amoxicillin Rash  . Ampicillin Rash    No orders of the defined types were placed in this encounter.   AUTHORIZATION INFORMATION Primary Insurance: 1D#: Group #:  Secondary Insurance: 1D#: Group #:  SCHEDULE INFORMATION: Date: 12/15/16 Time: Location:[facility]

## 2016-11-13 NOTE — Telephone Encounter (Signed)
Patient left a voice message that she has been waiting for someone to call her back regarding a colonoscopy. She has some questions. Please call work or cell

## 2016-11-23 ENCOUNTER — Telehealth: Payer: Self-pay | Admitting: *Deleted

## 2016-11-23 NOTE — Telephone Encounter (Signed)
11/23/16 9:30 Spoke with Dana Dickerson at [facility] for Colonoscopy [phone] / [id number] Auth #: [id number].

## 2016-11-23 NOTE — Telephone Encounter (Signed)
-----   Message from Vanetta Mulders, [place] sent at 11/13/2016  4:02 PM EDT ----- G920.100 Gynecological examination with abnormal findings  12/15/16 Dr.  Vicente Males at [facility]

## 2016-12-15 ENCOUNTER — Encounter
Admission: RE | Disposition: A | Discharge status: Home / Self Care | Payer: Self-pay | Source: Ambulatory Visit | Attending: Gastroenterology

## 2016-12-15 ENCOUNTER — Encounter: Payer: Self-pay | Admitting: *Deleted

## 2016-12-15 ENCOUNTER — Ambulatory Visit: Payer: 59 | Admitting: Anesthesiology

## 2016-12-15 ENCOUNTER — Ambulatory Visit
Admission: RE | Admit: 2016-12-15 | Discharge: 2016-12-15 | Disposition: A | Discharge status: Home / Self Care | Payer: 59 | Source: Ambulatory Visit | Attending: Gastroenterology | Admitting: Gastroenterology

## 2016-12-15 DIAGNOSIS — D122 Benign neoplasm of ascending colon: Secondary | ICD-10-CM | POA: Diagnosis not present

## 2016-12-15 DIAGNOSIS — Z87891 Personal history of nicotine dependence: Secondary | ICD-10-CM | POA: Insufficient documentation

## 2016-12-15 DIAGNOSIS — D128 Benign neoplasm of rectum: Secondary | ICD-10-CM | POA: Insufficient documentation

## 2016-12-15 DIAGNOSIS — D125 Benign neoplasm of sigmoid colon: Secondary | ICD-10-CM | POA: Insufficient documentation

## 2016-12-15 DIAGNOSIS — F419 Anxiety disorder, unspecified: Secondary | ICD-10-CM | POA: Diagnosis not present

## 2016-12-15 DIAGNOSIS — Z01411 Encounter for gynecological examination (general) (routine) with abnormal findings: Secondary | ICD-10-CM

## 2016-12-15 DIAGNOSIS — K621 Rectal polyp: Secondary | ICD-10-CM

## 2016-12-15 DIAGNOSIS — Z79899 Other long term (current) drug therapy: Secondary | ICD-10-CM | POA: Insufficient documentation

## 2016-12-15 DIAGNOSIS — Z1211 Encounter for screening for malignant neoplasm of colon: Secondary | ICD-10-CM | POA: Insufficient documentation

## 2016-12-15 DIAGNOSIS — D123 Benign neoplasm of transverse colon: Secondary | ICD-10-CM | POA: Insufficient documentation

## 2016-12-15 HISTORY — DX: Thyrotoxicosis, unspecified without thyrotoxic crisis or storm: E05.90

## 2016-12-15 HISTORY — PX: COLONOSCOPY WITH PROPOFOL: SHX5780

## 2016-12-15 SURGERY — COLONOSCOPY WITH PROPOFOL
Anesthesia: General

## 2016-12-15 MED ORDER — SODIUM CHLORIDE 0.9 % IV SOLN
INTRAVENOUS | Status: DC
  Administered 2016-12-15: 11:00:00 via INTRAVENOUS

## 2016-12-15 MED ORDER — PROPOFOL 10 MG/ML IV BOLUS
INTRAVENOUS | Status: AC
  Filled 2016-12-15: qty 20

## 2016-12-15 MED ORDER — PROPOFOL 500 MG/50ML IV EMUL
INTRAVENOUS | Status: DC | PRN
  Administered 2016-12-15: 140 ug/kg/min via INTRAVENOUS

## 2016-12-15 MED ORDER — PROPOFOL 10 MG/ML IV BOLUS
INTRAVENOUS | Status: DC | PRN
  Administered 2016-12-15: 110 mg via INTRAVENOUS

## 2016-12-15 NOTE — Anesthesia Postprocedure Evaluation (Signed)
Anesthesia Post Note  Patient: Dana Dickerson  Procedure(s) Performed: Procedure(s) (LRB): COLONOSCOPY WITH PROPOFOL (N/A)  Patient location during evaluation: Endoscopy Anesthesia Type: General Level of consciousness: awake and alert and oriented Pain management: pain level controlled Vital Signs Assessment: post-procedure vital signs reviewed and stable Respiratory status: spontaneous breathing, nonlabored ventilation and respiratory function stable Cardiovascular status: blood pressure returned to baseline and stable Postop Assessment: no signs of nausea or vomiting Anesthetic complications: no     Last Vitals:  Vitals:   12/15/16 1021 12/15/16 1145  BP: (!) 156/92 112/77  Pulse: 79 85  Resp: 16 16  Temp: 37.7 C 36.6 C    Last Pain:  Vitals:   12/15/16 1145  TempSrc: Tympanic                 Prakriti Carignan

## 2016-12-15 NOTE — H&P (Signed)
  Dana Bellows MD [address], [place],  [address] Phone: [phone] Fax : [phone]  Primary Care Physician:  Dion Body, MD Primary Gastroenterologist:  Dr. Jonathon Dickerson   Pre-Procedure History & Physical: HPI:  Dana Dickerson is a 56 y.o. female is here for an colonoscopy.   Past Medical History:  Diagnosis Date  . Anxiety   . Hyperthyroidism   . Thyroid cyst     Past Surgical History:  Procedure Laterality Date  . DILATION AND CURETTAGE OF UTERUS      Prior to Admission medications   Medication Sig Start Date End Date Taking? Authorizing Provider  buPROPion (WELLBUTRIN XL) 150 MG 24 hr tablet Take 1 tablet (150 mg total) by mouth daily. 10/26/16  Yes Shambley, Melody N, CNM  Fish Oil OIL by Does not apply route.   Yes [provider]  Iodine, Kelp, 0.15 MG TABS Take 1 tablet (0.15 mg total) by mouth daily. 10/26/16  Yes Shambley, Melody N, CNM  Multiple Vitamin (MULTIVITAMIN) tablet Take 1 tablet by mouth daily.   Yes [provider]  Multiple Vitamins-Minerals (HAIR SKIN AND NAILS FORMULA PO) Take by mouth.   Yes [provider]    Allergies as of 11/13/2016 - Review Complete 10/26/2016  Allergen Reaction Noted  . Codeine Shortness Of Breath 03/31/2013  . Amoxicillin Rash 04/21/2015  . Ampicillin Rash 03/31/2013    Family History  Problem Relation Age of Onset  . Aneurysm Maternal Grandfather   . Stroke Other     Social History   Social History  . Marital status: Married    Spouse name: N/A  . Number of children: N/A  . Years of education: N/A   Occupational History  . Not on file.   Social History Main Topics  . Smoking status: Former Smoker    Quit date: 01/29/2013  . Smokeless tobacco: Never Used  . Alcohol use No  . Drug use: No  . Sexual activity: Not Currently   Other Topics Concern  . Not on file   Social History Narrative  . No narrative on file    Review of Systems: See HPI, otherwise  negative ROS  Physical Exam: BP (!) 156/92   Pulse 79   Temp 99.8 F (37.7 C) (Tympanic)   Resp 16   Ht 5\' 5"  (1.651 m)   Wt 189 lb (85.7 kg)   SpO2 100%   BMI 31.45 kg/m  General:   Alert,  pleasant and cooperative in NAD Head:  Normocephalic and atraumatic. Neck:  Supple; no masses or thyromegaly. Lungs:  Clear throughout to auscultation.    Heart:  Regular rate and rhythm. Abdomen:  Soft, nontender and nondistended. Normal bowel sounds, without guarding, and without rebound.   Neurologic:  Alert and  oriented x4;  grossly normal neurologically.  Impression/Plan: Dana Dickerson is here for an colonoscopy to be performed for Screening colonoscopy average risk   Risks, benefits, limitations, and alternatives regarding  colonoscopy have been reviewed with the patient.  Questions have been answered.  All parties agreeable.   Dana Bellows, MD  12/15/2016, 11:09 AM

## 2016-12-15 NOTE — Anesthesia Post-op Follow-up Note (Cosign Needed)
Anesthesia QCDR form completed.        

## 2016-12-15 NOTE — Transfer of Care (Signed)
Immediate Anesthesia Transfer of Care Note  Patient: Dana Dickerson  Procedure(s) Performed: Procedure(s): COLONOSCOPY WITH PROPOFOL (N/A)  Patient Location: PACU and Endoscopy Unit  Anesthesia Type:General  Level of Consciousness: awake, alert  and oriented  Airway & Oxygen Therapy: Patient Spontanous Breathing and Patient connected to nasal cannula oxygen  Post-op Assessment: Report given to RN and Post -op Vital signs reviewed and stable  Post vital signs: Reviewed and stable  Last Vitals:  Vitals:   12/15/16 1021 12/15/16 1145  BP: (!) 156/92 112/77  Pulse: 79 85  Resp: 16 16  Temp: 37.7 C 36.6 C    Last Pain:  Vitals:   12/15/16 1145  TempSrc: Tympanic         Complications: No apparent anesthesia complications

## 2016-12-15 NOTE — Anesthesia Preprocedure Evaluation (Addendum)
Anesthesia Evaluation  Patient identified by MRN, date of birth, ID band Patient awake    Reviewed: Allergy & Precautions, NPO status , Patient's Chart, lab work & pertinent test results  History of Anesthesia Complications Negative for: history of anesthetic complications  Airway Mallampati: II  TM Distance: >3 FB Neck ROM: Full    Dental  (+) Upper Dentures, Partial Lower   Pulmonary neg sleep apnea, neg COPD, former smoker,    breath sounds clear to auscultation- rhonchi (-) wheezing      Cardiovascular Exercise Tolerance: Good (-) hypertension(-) CAD and (-) Past MI  Rhythm:Regular Rate:Normal - Systolic murmurs and - Diastolic murmurs    Neuro/Psych Anxiety negative neurological ROS     GI/Hepatic negative GI ROS, Neg liver ROS,   Endo/Other  neg diabetesHyperthyroidism   Renal/GU negative Renal ROS     Musculoskeletal negative musculoskeletal ROS (+)   Abdominal (+) + obese,   Peds  Hematology negative hematology ROS (+)   Anesthesia Other Findings Past Medical History: No date: Anxiety No date: Hyperthyroidism No date: Thyroid cyst   Reproductive/Obstetrics                             Anesthesia Physical Anesthesia Plan  ASA: II  Anesthesia Plan: General   Post-op Pain Management:    Induction: Intravenous  PONV Risk Score and Plan: 2 and Propofol  Airway Management Planned: Natural Airway  Additional Equipment:   Intra-op Plan:   Post-operative Plan:   Informed Consent: I have reviewed the patients History and Physical, chart, labs and discussed the procedure including the risks, benefits and alternatives for the proposed anesthesia with the patient or authorized representative who has indicated his/her understanding and acceptance.   Dental advisory given  Plan Discussed with: CRNA and Anesthesiologist  Anesthesia Plan Comments:        Anesthesia Quick  Evaluation

## 2016-12-15 NOTE — Op Note (Signed)
[facility] Gastroenterology Patient Name: Dana Dickerson Procedure Date: 12/15/2016 11:14 AM MRN: [id number] Account #: [id number] Date of Birth: 1961/05/06 Admit Type: Outpatient Age: 56 Room: [facility] ENDO ROOM 4 Gender: Female Note Status: Finalized Procedure:            Colonoscopy Indications:          Screening for colorectal malignant neoplasm Providers:            Jonathon Bellows MD, MD Referring MD:         Dion Body (Referring MD) Medicines:            Monitored Anesthesia Care Complications:        No immediate complications. Procedure:            Pre-Anesthesia Assessment:                       - Prior to the procedure, a History and Physical was                        performed, and patient medications, allergies and                        sensitivities were reviewed. The patient's tolerance of                        previous anesthesia was reviewed.                       - The risks and benefits of the procedure and the                        sedation options and risks were discussed with the                        patient. All questions were answered and informed                        consent was obtained.                       - ASA Grade Assessment: II - A patient with mild                        systemic disease.                       After obtaining informed consent, the colonoscope was                        passed under direct vision. Throughout the procedure,                        the patient's blood pressure, pulse, and oxygen                        saturations were monitored continuously. The                        Colonoscope was introduced through the anus and  advanced to the the cecum, identified by the                        appendiceal orifice, IC valve and transillumination.                        The colonoscopy was performed without difficulty. The                        patient tolerated the procedure well.  The quality of                        the bowel preparation was good. Findings:      Four sessile polyps were found in the sigmoid colon, transverse colon       and ascending colon. The polyps were 6 to 7 mm in size. These polyps       were removed with a cold snare. Resection and retrieval were complete.      A 7 mm polyp was found in the rectum. The polyp was semi-pedunculated.       The polyp was removed with a hot snare. Resection and retrieval were       complete.      The exam was otherwise without abnormality on direct and retroflexion       views. Impression:           - Four 6 to 7 mm polyps in the sigmoid colon, in the                        transverse colon and in the ascending colon, removed                        with a cold snare. Resected and retrieved.                       - One 7 mm polyp in the rectum, removed with a hot                        snare. Resected and retrieved.                       - The examination was otherwise normal on direct and                        retroflexion views. Recommendation:       - Discharge patient to home (with escort).                       - Advance diet as tolerated today.                       - Continue present medications.                       - Await pathology results.                       - Repeat colonoscopy for surveillance based on                        pathology results. Procedure Code(s):    ---  Professional ---                       [phone], Colonoscopy, flexible; with removal of tumor(s),                        polyp(s), or other lesion(s) by snare technique Diagnosis Code(s):    --- Professional ---                       Z12.11, Encounter for screening for malignant neoplasm                        of colon                       D12.5, Benign neoplasm of sigmoid colon                       D12.3, Benign neoplasm of transverse colon (hepatic                        flexure or splenic flexure)                        D12.2, Benign neoplasm of ascending colon                       K62.1, Rectal polyp CPT copyright 2016 American Medical Association. All rights reserved. The codes documented in this report are preliminary and upon coder review may  be revised to meet current compliance requirements. Jonathon Bellows, MD Jonathon Bellows MD, MD 12/15/2016 11:40:55 AM This report has been signed electronically. Number of Addenda: 0 Note Initiated On: 12/15/2016 11:14 AM Scope Withdrawal Time: 0 hours 17 minutes 28 seconds  Total Procedure Duration: 0 hours 20 minutes 28 seconds       [facility]

## 2016-12-18 ENCOUNTER — Encounter: Payer: Self-pay | Admitting: Gastroenterology

## 2016-12-18 LAB — SURGICAL PATHOLOGY

## 2016-12-24 ENCOUNTER — Encounter: Payer: Self-pay | Admitting: Gastroenterology

## 2016-12-25 ENCOUNTER — Other Ambulatory Visit: Payer: Self-pay

## 2016-12-25 DIAGNOSIS — F419 Anxiety disorder, unspecified: Secondary | ICD-10-CM | POA: Insufficient documentation

## 2017-01-08 ENCOUNTER — Emergency Department (HOSPITAL_COMMUNITY): Payer: No Typology Code available for payment source

## 2017-01-08 ENCOUNTER — Emergency Department (HOSPITAL_COMMUNITY)
Admission: EM | Admit: 2017-01-08 | Discharge: 2017-01-08 | Disposition: A | Discharge status: Home / Self Care | Payer: No Typology Code available for payment source | Attending: Emergency Medicine | Admitting: Emergency Medicine

## 2017-01-08 ENCOUNTER — Encounter (HOSPITAL_COMMUNITY): Payer: Self-pay | Admitting: Emergency Medicine

## 2017-01-08 DIAGNOSIS — Y939 Activity, unspecified: Secondary | ICD-10-CM | POA: Insufficient documentation

## 2017-01-08 DIAGNOSIS — Z885 Allergy status to narcotic agent status: Secondary | ICD-10-CM | POA: Diagnosis not present

## 2017-01-08 DIAGNOSIS — E0590 Thyrotoxicosis, unspecified without thyrotoxic crisis or storm: Secondary | ICD-10-CM | POA: Insufficient documentation

## 2017-01-08 DIAGNOSIS — Z880 Allergy status to penicillin: Secondary | ICD-10-CM | POA: Insufficient documentation

## 2017-01-08 DIAGNOSIS — S161XXA Strain of muscle, fascia and tendon at neck level, initial encounter: Secondary | ICD-10-CM | POA: Diagnosis not present

## 2017-01-08 DIAGNOSIS — Y92410 Unspecified street and highway as the place of occurrence of the external cause: Secondary | ICD-10-CM | POA: Insufficient documentation

## 2017-01-08 DIAGNOSIS — Y999 Unspecified external cause status: Secondary | ICD-10-CM | POA: Insufficient documentation

## 2017-01-08 DIAGNOSIS — R0789 Other chest pain: Secondary | ICD-10-CM | POA: Diagnosis present

## 2017-01-08 DIAGNOSIS — Z87891 Personal history of nicotine dependence: Secondary | ICD-10-CM | POA: Insufficient documentation

## 2017-01-08 MED ORDER — METHOCARBAMOL 500 MG PO TABS: 500.0000 mg | ORAL_TABLET | Freq: Two times a day (BID) | ORAL | 0 refills | Status: DC

## 2017-01-08 MED ORDER — IBUPROFEN 800 MG PO TABS: 800.0000 mg | ORAL_TABLET | Freq: Three times a day (TID) | ORAL | 0 refills | Status: DC | PRN
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
## 2017-01-08 NOTE — Discharge Instructions (Signed)
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
## 2017-01-08 NOTE — ED Notes (Signed)
Got patient undress on the monitor patient is resting 

## 2017-01-08 NOTE — ED Notes (Signed)
Pt now complaining of some neck tenderness, edp aware, per edp no c-collar

## 2017-01-08 NOTE — ED Notes (Signed)
Patient transported to CT 

## 2017-02-14 ENCOUNTER — Ambulatory Visit (INDEPENDENT_AMBULATORY_CARE_PROVIDER_SITE_OTHER): Payer: 59 | Admitting: Certified Nurse Midwife

## 2017-02-14 ENCOUNTER — Encounter: Payer: Self-pay | Admitting: Certified Nurse Midwife

## 2017-02-14 VITALS — BP 158/97 | HR 72 | Temp 97.7°F | Wt 189.2 lb

## 2017-02-14 DIAGNOSIS — R8299 Other abnormal findings in urine: Secondary | ICD-10-CM

## 2017-02-14 DIAGNOSIS — R82998 Other abnormal findings in urine: Secondary | ICD-10-CM

## 2017-02-14 DIAGNOSIS — R1031 Right lower quadrant pain: Secondary | ICD-10-CM | POA: Diagnosis not present

## 2017-02-14 LAB — POCT URINALYSIS DIPSTICK
Bilirubin, UA: NEGATIVE
Blood, UA: NEGATIVE
Glucose, UA: NEGATIVE
Ketones, UA: NEGATIVE
Nitrite, UA: NEGATIVE
PH UA: 6 (ref 5.0–8.0)
PROTEIN UA: NEGATIVE
Spec Grav, UA: 1.015 (ref 1.010–1.025)
UROBILINOGEN UA: 0.2 U/dL

## 2017-02-14 NOTE — Progress Notes (Signed)
GYN ENCOUNTER NOTE  Subjective:       Dana Dickerson is a 56 y.o. G3P3 female is here for gynecologic evaluation of the following issues:  1. Right lower quadrant pain that started 3 days ago.  She describes the pain as constant, sharpe pain that worsens when she bends over or swats down. She denies fever and states that she has had some nausea but it may be due to her anxiety. She is post menopausal x 16 yrs. She denies vaginal bleeding or abnormal discharge. She denies burning frequency or urgency with urination. Pt. States that she fell several days ago going down the stairs. Her left foot went forward and her right foot went back.    Gynecologic History No LMP recorded. Patient is postmenopausal. Contraception: post menopausal status Last Pap: 10/26/2016 Results were: normal Last mammogram: 04/24/2016 Results were: normal  Obstetric History OB History  Gravida Para Term Preterm AB Living  3 3          SAB TAB Ectopic Multiple Live Births               # Outcome Date GA Lbr Len/2nd Weight Sex Delivery Anes PTL Lv  3 Para 1984    F Vag-Spont     2 [place]         Past Medical History:  Diagnosis Date  . Anxiety   . Hyperthyroidism   . Thyroid cyst     Past Surgical History:  Procedure Laterality Date  . COLONOSCOPY WITH PROPOFOL N/A 12/15/2016   Procedure: COLONOSCOPY WITH PROPOFOL;  Surgeon: Jonathon Bellows, MD;  Location: [facility] ENDOSCOPY;  Service: Endoscopy;  Laterality: N/A;  . DILATION AND CURETTAGE OF UTERUS      Current Outpatient Prescriptions on File Prior to Visit  Medication Sig Dispense Refill  . buPROPion (WELLBUTRIN XL) 150 MG 24 hr tablet Take 1 tablet (150 mg total) by mouth daily. 30 tablet 3  . Fish Oil OIL by Does not apply route.    . Ginseng 100 MG CAPS Take by mouth.    Marland Kitchen ibuprofen (ADVIL,MOTRIN) 800 MG tablet Take 1 tablet (800 mg total) by mouth every 8 (eight) hours as needed. 21 tablet 0  . Iodine,  Kelp, 0.15 MG TABS Take 1 tablet (0.15 mg total) by mouth daily. 30 tablet 11  . Multiple Vitamins-Minerals (HAIR SKIN AND NAILS FORMULA PO) Take by mouth.    . methimazole (TAPAZOLE) 5 MG tablet     . methocarbamol (ROBAXIN) 500 MG tablet Take 1 tablet (500 mg total) by mouth 2 (two) times daily. (Patient not taking: Reported on 02/14/2017) 20 tablet 0  . Multiple Vitamin (MULTIVITAMIN) tablet Take 1 tablet by mouth daily.    . sertraline (ZOLOFT) 50 MG tablet Take by mouth.    . sertraline (ZOLOFT) 50 MG tablet Take by mouth.     No current facility-administered medications on file prior to visit.     Allergies  Allergen Reactions  . Codeine Shortness Of Breath  . Amoxicillin Rash  . Ampicillin Rash    Social History   Social History  . Marital status: Married    Spouse name: N/A  . Number of children: N/A  . Years of education: N/A   Occupational History  . Not on file.   Social History Main Topics  . Smoking status: Former Smoker    Quit date: 01/29/2013  .  Smokeless tobacco: Never Used  . Alcohol use No  . Drug use: No  . Sexual activity: Not Currently   Other Topics Concern  . Not on file   Social History Narrative  . No narrative on file    Family History  Problem Relation Age of Onset  . Aneurysm Maternal Grandfather   . Stroke Other     The following portions of the patient's history were reviewed and updated as appropriate: allergies, current medications, past family history, past medical history, past social history, past surgical history and problem list.  Review of Systems Review of Systems - Negative except As mentioned in HPI Review of Systems - General ROS: negative for - chills, fatigue, fever, hot flashes, malaise or night sweats Hematological and Lymphatic ROS: negative for - bleeding problems or swollen lymph nodes Gastrointestinal ROS: negative for - abdominal pain, blood in stools, change in bowel habits and nausea/vomiting Musculoskeletal  ROS: negative for - joint pain, muscle pain or muscular weakness Genito-Urinary ROS: negative for -  dyspareunia, dysuria, genital discharge, genital ulcers, hematuria, incontinence,, nocturia   Objective:   BP (!) 158/97   Pulse 72   Temp 97.7 F (36.5 C) (Oral)   Wt 189 lb 3.2 oz (85.8 kg)   BMI 31.48 kg/m  CONSTITUTIONAL: Well-developed, well-nourished, obese female in no acute distress.  HENT:  Normocephalic, atraumatic.  NECK: Normal range of motion SKIN: Skin is warm and dry. No rash noted. Not diaphoretic. No erythema. No pallor. [place]: Alert and oriented to person, place, and time. PSYCHIATRIC: Normal mood and affect. Normal behavior. Normal judgment and thought content. Negative CVA tenderness  CARDIOVASCULAR:Not Examined RESPIRATORY: Not Examined BREASTS: Not Examined ABDOMEN: Soft, non distended; Non tender.  Bowel sounds present, no rebound tenderness.  PELVIC:Declines at this time, she states that she does still have uterus & ovaries. MUSCULOSKELETAL: Normal range of motion. No tenderness.  No cyanosis, clubbing, or edema.   Assessment:   1. Right lower quadrant abdominal pain - POCT urinalysis dipstick - US Pelvis Complete; Future    Plan:   Reviewed possibility that pain could be to fall if she stretched the muscles/ligaments pain could caused from that. Suggested use of Ibuprofen, tylenol , icy hot, or ice to affected area. BP elevated on todays visit. Repeat continued to be 140's over 90. Pt instructed to return within1 wk for nurse visit to check BP . She agrees to plan Will follow up with results of urine and ultrasound.   Philip Aspen, CNM

## 2017-02-14 NOTE — Patient Instructions (Signed)
Pelvic Pain, Female °Pelvic pain is pain in your lower belly (abdomen), below your belly button and between your hips. The pain may start suddenly (acute), keep coming back (recurring), or last a long time (chronic). Pelvic pain that lasts longer than six months is considered chronic. There are many causes of pelvic pain. Sometimes the cause of your pelvic pain is not known. °Follow these instructions at home: °· Take over-the-counter and prescription medicines only as told by your doctor. °· Rest as told by your doctor. °· Do not have sex it if hurts. °· Keep a journal of your pelvic pain. Write down: °? When the pain started. °? Where the pain is located. °? What seems to make the pain better or worse, such as food or your menstrual cycle. °? Any symptoms you have along with the pain. °· Keep all follow-up visits as told by your doctor. This is important. °Contact a doctor if: °· Medicine does not help your pain. °· Your pain comes back. °· You have new symptoms. °· You have unusual vaginal discharge or bleeding. °· You have a fever or chills. °· You are having a hard time pooping (constipation). °· You have blood in your pee (urine) or poop (stool). °· Your pee smells bad. °· You feel weak or lightheaded. °Get help right away if: °· You have sudden pain that is very bad. °· Your pain continues to get worse. °· You have very bad pain and also have any of the following symptoms: °? A fever. °? Feeling stick to your stomach (nausea). °? Throwing up (vomiting). °? Being very sweaty. °· You pass out (lose consciousness). °This information is not intended to replace advice given to you by your health care provider. Make sure you discuss any questions you have with your health care provider. °Document Released: 11/29/2007 Document Revised: 07/07/2015 Document Reviewed: 04/02/2015 °Elsevier Interactive Patient Education © 2018 Elsevier Inc. ° °

## 2017-02-16 ENCOUNTER — Encounter: Payer: Self-pay | Admitting: Certified Nurse Midwife

## 2017-02-16 LAB — URINE CULTURE

## 2017-03-01 ENCOUNTER — Ambulatory Visit (INDEPENDENT_AMBULATORY_CARE_PROVIDER_SITE_OTHER): Payer: 59 | Admitting: Obstetrics and Gynecology

## 2017-03-01 ENCOUNTER — Encounter: Payer: Self-pay | Admitting: Obstetrics and Gynecology

## 2017-03-01 ENCOUNTER — Ambulatory Visit (INDEPENDENT_AMBULATORY_CARE_PROVIDER_SITE_OTHER): Payer: 59

## 2017-03-01 VITALS — BP 162/90 | HR 98 | Ht 64.5 in | Wt 190.6 lb

## 2017-03-01 DIAGNOSIS — R1900 Intra-abdominal and pelvic swelling, mass and lump, unspecified site: Secondary | ICD-10-CM

## 2017-03-01 DIAGNOSIS — E669 Obesity, unspecified: Secondary | ICD-10-CM | POA: Diagnosis not present

## 2017-03-01 DIAGNOSIS — N949 Unspecified condition associated with female genital organs and menstrual cycle: Secondary | ICD-10-CM | POA: Diagnosis not present

## 2017-03-01 DIAGNOSIS — R1031 Right lower quadrant pain: Secondary | ICD-10-CM | POA: Diagnosis not present

## 2017-03-01 DIAGNOSIS — E66811 Obesity, class 1: Secondary | ICD-10-CM

## 2017-03-01 NOTE — Progress Notes (Signed)
Here  For pelvic ultrasound and review of it.  Indications: Pelvic pain  Findings:  The uterus measures 6.3 x 2.8 x 3.5 cm. Echo texture is homogenous without evidence of focal masses.  The Endometrium measures 2.4 mm.  Right Ovary measures 2.1 x 1.1 x 1.4 cm. It is normal in appearance. Left Ovary measures 2.3 x 1.5 x 1.3 cm. It is normal appearance. Survey of the adnexa demonstrates a vascular hypoechoic mass posterior right adnexa that measures 2.1 x 2.1 x 3 cm.  This does not communicate with the ovary or uterus. There is no free fluid in the cul de sac.  Impression: 1. Survey of the adnexa demonstrates a vascular hypoechoic mass posterior right adnexa that measures 2.1 x 2.1 x 3 cm.   Recommendations: 1.Clinical correlation with the patient's History and Physical Exam.   Tyrone Apple  Scan reviewed and agree with findings.  Patient notified at today's visit. Will add labs for cancer screening and ordered CT with contrast of abdomen and pelvic and consult with general surgeon after wards.

## 2017-03-09 ENCOUNTER — Encounter: Payer: Self-pay | Admitting: Obstetrics and Gynecology

## 2017-03-14 ENCOUNTER — Encounter: Payer: Self-pay | Admitting: Obstetrics and Gynecology

## 2017-03-14 ENCOUNTER — Other Ambulatory Visit: Payer: Self-pay | Admitting: *Deleted

## 2017-03-22 ENCOUNTER — Encounter: Payer: Self-pay | Admitting: Gastroenterology

## 2017-03-22 ENCOUNTER — Encounter: Payer: Self-pay | Admitting: Obstetrics and Gynecology

## 2017-03-23 ENCOUNTER — Ambulatory Visit: Admission: RE | Admit: 2017-03-23 | Payer: 59 | Source: Ambulatory Visit

## 2017-03-23 ENCOUNTER — Ambulatory Visit
Admission: RE | Admit: 2017-03-23 | Discharge: 2017-03-23 | Disposition: A | Discharge status: Home / Self Care | Payer: 59 | Source: Ambulatory Visit | Attending: Obstetrics and Gynecology | Admitting: Obstetrics and Gynecology

## 2017-03-23 DIAGNOSIS — M799 Soft tissue disorder, unspecified: Secondary | ICD-10-CM | POA: Diagnosis not present

## 2017-03-23 DIAGNOSIS — N949 Unspecified condition associated with female genital organs and menstrual cycle: Secondary | ICD-10-CM | POA: Insufficient documentation

## 2017-03-23 DIAGNOSIS — I700 Atherosclerosis of aorta: Secondary | ICD-10-CM | POA: Diagnosis not present

## 2017-03-23 DIAGNOSIS — N281 Cyst of kidney, acquired: Secondary | ICD-10-CM | POA: Insufficient documentation

## 2017-03-23 MED ORDER — IOPAMIDOL (ISOVUE-300) INJECTION 61%
100.0000 mL | Freq: Once | INTRAVENOUS | Status: AC | PRN
  Administered 2017-03-23: 100 mL via INTRAVENOUS

## 2017-03-27 ENCOUNTER — Other Ambulatory Visit: Payer: Self-pay | Admitting: Obstetrics and Gynecology

## 2017-03-27 ENCOUNTER — Ambulatory Visit (INDEPENDENT_AMBULATORY_CARE_PROVIDER_SITE_OTHER): Payer: 59 | Admitting: Gastroenterology

## 2017-03-27 ENCOUNTER — Encounter: Payer: Self-pay | Admitting: Gastroenterology

## 2017-03-27 VITALS — BP 163/85 | HR 106 | Temp 98.1°F | Ht 64.5 in | Wt 188.8 lb

## 2017-03-27 DIAGNOSIS — R935 Abnormal findings on diagnostic imaging of other abdominal regions, including retroperitoneum: Secondary | ICD-10-CM | POA: Diagnosis not present

## 2017-03-27 NOTE — Progress Notes (Signed)
Primary Care Physician: Joylene Igo, CNM  Primary Gastroenterologist:  Dr. Lucilla Lame  Chief Complaint  Patient presents with  . Pelvic mass    HPI: Dana Dickerson is a 56 y.o. female here for an abnormal CT scan of the pelvis.  The patient had a colonoscopy a few months ago by Dr. Vicente Males. The patient was found to have a few adenomatous polyps.  The patient had a CT scan that showed a lesion in the posterior area of the uterus but not connected to the uterus of the ovaries.  The patient was sent to [place] for further management of this issue.  Current Outpatient Prescriptions  Medication Sig Dispense Refill  . buPROPion (WELLBUTRIN XL) 150 MG 24 hr tablet Take 1 tablet (150 mg total) by mouth daily. 30 tablet 3  . Clobetasol Propionate (TEMOVATE) 0.05 % external spray APPLY TWICE DAILY TO SCALP AS NEEDED  3  . Clobetasol Propionate 0.05 % shampoo Use as directed as shampoo  6  . Fish Oil OIL by Does not apply route.    Marland Kitchen ibuprofen (ADVIL,MOTRIN) 800 MG tablet Take 1 tablet (800 mg total) by mouth every 8 (eight) hours as needed. 21 tablet 0  . Iodine, Kelp, 0.15 MG TABS Take 1 tablet (0.15 mg total) by mouth daily. 30 tablet 11  . Multiple Vitamin (MULTIVITAMIN) tablet Take 1 tablet by mouth daily.    . Multiple Vitamins-Minerals (HAIR SKIN AND NAILS FORMULA PO) Take by mouth.    . Ginseng 100 MG CAPS Take by mouth.    . methimazole (TAPAZOLE) 5 MG tablet     . methocarbamol (ROBAXIN) 500 MG tablet Take 1 tablet (500 mg total) by mouth 2 (two) times daily. (Patient not taking: Reported on 02/14/2017) 20 tablet 0   No current facility-administered medications for this visit.     Allergies as of 03/27/2017 - Review Complete 03/27/2017  Allergen Reaction Noted  . Codeine Shortness Of Breath 03/31/2013  . Amoxicillin Rash 04/21/2015  . Ampicillin Rash 03/31/2013    ROS:  General: Negative for anorexia, weight loss, fever, chills, fatigue, weakness. ENT: Negative for  hoarseness, difficulty swallowing , nasal congestion. CV: Negative for chest pain, angina, palpitations, dyspnea on exertion, peripheral edema.  Respiratory: Negative for dyspnea at rest, dyspnea on exertion, cough, sputum, wheezing.  GI: See history of present illness. GU:  Negative for dysuria, hematuria, urinary incontinence, urinary frequency, nocturnal urination.  Endo: Negative for unusual weight change.    Physical Examination:   BP (!) 163/85   Pulse (!) 106   Temp 98.1 F (36.7 C) (Oral)   Ht 5' 4.5" (1.638 m)   Wt 188 lb 12.8 oz (85.6 kg)   BMI 31.91 kg/m   General: Well-nourished, well-developed in no acute distress.  Eyes: No icterus. Conjunctivae pink. Extremities: No lower extremity edema. No clubbing or deformities. Neuro: Alert and oriented x 3.  Grossly intact. Skin: Warm and dry, no jaundice.   Psych: Alert and cooperative, normal mood and affect.  Labs:    Imaging Studies: US Pelvis Transvanginal Non-ob (tv Only)  Result Date: 03/01/2017 ULTRASOUND REPORT Location: [facility] Date of Service: 03/01/17 Indications: Pelvic pain Findings: The uterus measures 6.3 x 2.8 x 3.5 cm. Echo texture is homogenous without evidence of focal masses. The Endometrium measures 2.4 mm. Right Ovary measures 2.1 x 1.1 x 1.4 cm. It is normal in appearance. Left Ovary measures 2.3 x 1.5 x 1.3 cm. It is normal appearance. Survey  of the adnexa demonstrates a vascular hypoechoic mass posterior right adnexa that measures 2.1 x 2.1 x 3 cm.  This does not communicate with the ovary or uterus. There is no free fluid in the cul de sac. Impression: 1. Survey of the adnexa demonstrates a vascular hypoechoic mass posterior right adnexa that measures 2.1 x 2.1 x 3 cm. Recommendations: 1.Clinical correlation with the patient's History and Physical Exam. Tyrone Apple Scan reviewed and agree with findings. Patient notified at today's visit. Melody Clemson, CNM   Ct Abdomen Pelvis W  Contrast  Result Date: 03/23/2017 CLINICAL DATA:  56 year old female with 3 cm right adnexal mass identified on outside pelvic ultrasound. EXAM: CT ABDOMEN AND PELVIS WITH CONTRAST TECHNIQUE: Multidetector CT imaging of the abdomen and pelvis was performed using the standard protocol following bolus administration of intravenous contrast. CONTRAST:  111mL ISOVUE-300 IOPAMIDOL (ISOVUE-300) INJECTION 61% COMPARISON:  03/01/2017 pelvic ultrasound from [facility]. FINDINGS: Lower chest: Unremarkable.  No pulmonary nodules. Hepatobiliary: The liver and gallbladder are unremarkable. No biliary dilatation. Pancreas: Unremarkable Spleen: Unremarkable Adrenals/Urinary Tract: The kidneys, adrenal glands and bladder are unremarkable except for a right renal cyst. Stomach/Bowel: Stomach is within normal limits. Appendix appears normal. No evidence of bowel wall thickening, distention, or inflammatory changes. Vascular/Lymphatic: Aortic atherosclerosis. No enlarged abdominal or pelvic lymph nodes. Reproductive: Uterus and bilateral adnexa are unremarkable. Other: A 3 x 3.5 cm oval soft tissue mass anterior to the sacrum within the posterior mid pelvis is identified. No other abnormalities in this area are noted. This mass is separate from the uterus and ovaries and lies adjacent to small bowel and sacrum but does not definitely connect to either structure. No ascites, focal collection or pneumoperitoneum. Musculoskeletal: No acute or significant osseous findings. IMPRESSION: 1. Indeterminate 3 x 3.5 cm soft tissue mass in the posterior mid pelvis, separate from the uterus and ovaries. Differential includes carcinoid, neural tumor, malignancy, or less likely extra uterine fibroid. Recommend surgical consultation. 2.  Aortic Atherosclerosis (ICD10-I70.0). Electronically Signed   By: Margarette Canada M.D.   On: 03/23/2017 09:28    Assessment and Plan:   Dana Dickerson is a 56 y.o. y/o female who was found to have a  lesion on a CT scan it does not appear to be involving the GI tract.  The patient will be set up for an endoscopic ultrasound of that area to see if a biopsy can be obtained of the lesion and to better delineate which organs it involves. If the patient needs to have it removed then surgery will be contacted.  The patient has been explained the plan and agrees with it.    Lucilla Lame, MD. Marval Regal   Note: This dictation was prepared with Dragon dictation along with smaller phrase technology. Any transcriptional errors that result from this process are unintentional.

## 2017-03-28 ENCOUNTER — Telehealth: Payer: Self-pay | Admitting: Obstetrics and Gynecology

## 2017-03-28 ENCOUNTER — Other Ambulatory Visit: Payer: Self-pay

## 2017-03-28 DIAGNOSIS — R935 Abnormal findings on diagnostic imaging of other abdominal regions, including retroperitoneum: Secondary | ICD-10-CM

## 2017-03-28 NOTE — Telephone Encounter (Signed)
Genetic testing done on 03/01/2017 was denied by the patient 's insurance company - did not meet guidelines - patient was notified and decided to CNL the testing.

## 2017-03-29 ENCOUNTER — Telehealth: Payer: Self-pay

## 2017-03-29 NOTE — Telephone Encounter (Signed)
  Oncology Nurse Navigator Documentation CT images reviewed by Dr. Cephas Darby. He thinks area of concern may be amendable to EUS biopsy and has agreed to EUS. Records sent to [facility] for scheduling as requested. Navigator Location: [facility] (03/29/17 1400)   )Navigator Encounter Type: Other;Diagnostic Results (03/29/17 1400)                                                    Time Spent with Patient: 15 (03/29/17 1400)

## 2017-04-02 LAB — THYROID PANEL WITH TSH
Free Thyroxine Index: 2.3 (ref 1.2–4.9)
T3 Uptake Ratio: 25 % (ref 24–39)
T4, Total: 9.1 ug/dL (ref 4.5–12.0)
TSH: 0.114 u[IU]/mL — AB (ref 0.450–4.500)

## 2017-04-02 LAB — CBC
HEMATOCRIT: 43.5 % (ref 34.0–46.6)
Hemoglobin: 14.9 g/dL (ref 11.1–15.9)
MCH: 31.4 pg (ref 26.6–33.0)
MCHC: 34.3 g/dL (ref 31.5–35.7)
MCV: 92 fL (ref 79–97)
Platelets: 289 10*3/uL (ref 150–379)
RBC: 4.75 x10E6/uL (ref 3.77–5.28)
RDW: 13.4 % (ref 12.3–15.4)
WBC: 9.3 10*3/uL (ref 3.4–10.8)

## 2017-04-02 LAB — CA 125: CANCER ANTIGEN (CA) 125: 9.9 U/mL (ref 0.0–38.1)

## 2017-04-02 LAB — VISTASEQ HERED. CANCER PANEL

## 2017-04-09 ENCOUNTER — Encounter: Payer: Self-pay | Admitting: Obstetrics and Gynecology

## 2017-04-09 ENCOUNTER — Other Ambulatory Visit: Payer: Self-pay | Admitting: Obstetrics & Gynecology

## 2017-04-09 DIAGNOSIS — Z1231 Encounter for screening mammogram for malignant neoplasm of breast: Secondary | ICD-10-CM

## 2017-04-12 HISTORY — PX: FLEXIBLE SIGMOIDOSCOPY: SHX5431

## 2017-04-26 ENCOUNTER — Ambulatory Visit (HOSPITAL_COMMUNITY)
Admission: RE | Admit: 2017-04-26 | Discharge: 2017-04-26 | Disposition: A | Discharge status: Home / Self Care | Payer: 59 | Source: Ambulatory Visit | Attending: Obstetrics & Gynecology | Admitting: Obstetrics & Gynecology

## 2017-04-26 DIAGNOSIS — Z1231 Encounter for screening mammogram for malignant neoplasm of breast: Secondary | ICD-10-CM

## 2017-08-01 ENCOUNTER — Telehealth: Payer: Self-pay | Admitting: Gastroenterology

## 2017-08-01 NOTE — Telephone Encounter (Signed)
Patient LVM regarding her appt to [facility]. Please call her.

## 2017-08-02 NOTE — Telephone Encounter (Signed)
Pt referred to [facility] on Monday, Feb 18th @11 :00am. Pt has been given the address and telephone number to the facility. She has also been advised, she will receive a call from Dr. Baldomero Lamy nurse a couple of days prior to her appt. Pt verbalized understanding.

## 2017-08-02 NOTE — Telephone Encounter (Signed)
Patient left another voice message needs you to call her back ASAP. She is waiting on you to get her a referral for surgery to [facility] or [place]. [phone]

## 2017-08-02 NOTE — Telephone Encounter (Signed)
Gen. surgery

## 2017-08-02 NOTE — Telephone Encounter (Signed)
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 This was discovered by the EUS that was done by Dr. Cephas Darby. He is recommending this to be removed. Does this need to be a Sports administrator or general surgeon?

## 2017-09-19 HISTORY — PX: OTHER SURGICAL HISTORY: SHX169

## 2017-09-19 HISTORY — PX: DIAGNOSTIC LAPAROSCOPY: SUR761

## 2017-10-30 ENCOUNTER — Encounter: Payer: 59 | Admitting: Obstetrics and Gynecology

## 2017-12-18 ENCOUNTER — Ambulatory Visit (INDEPENDENT_AMBULATORY_CARE_PROVIDER_SITE_OTHER): Payer: Managed Care, Other (non HMO) | Admitting: Adult Health

## 2017-12-18 ENCOUNTER — Encounter: Payer: Self-pay | Admitting: Adult Health

## 2017-12-18 ENCOUNTER — Other Ambulatory Visit (HOSPITAL_COMMUNITY)
Admission: RE | Admit: 2017-12-18 | Discharge: 2017-12-18 | Disposition: A | Discharge status: Home / Self Care | Payer: Managed Care, Other (non HMO) | Source: Ambulatory Visit | Attending: Adult Health | Admitting: Adult Health

## 2017-12-18 VITALS — BP 163/89 | HR 60 | Ht 64.0 in | Wt 187.0 lb

## 2017-12-18 DIAGNOSIS — R030 Elevated blood-pressure reading, without diagnosis of hypertension: Secondary | ICD-10-CM | POA: Diagnosis not present

## 2017-12-18 DIAGNOSIS — Z01419 Encounter for gynecological examination (general) (routine) without abnormal findings: Secondary | ICD-10-CM | POA: Diagnosis not present

## 2017-12-18 DIAGNOSIS — Z13220 Encounter for screening for lipoid disorders: Secondary | ICD-10-CM

## 2017-12-18 DIAGNOSIS — Z1212 Encounter for screening for malignant neoplasm of rectum: Secondary | ICD-10-CM

## 2017-12-18 DIAGNOSIS — Z01411 Encounter for gynecological examination (general) (routine) with abnormal findings: Secondary | ICD-10-CM | POA: Diagnosis not present

## 2017-12-18 DIAGNOSIS — Z1211 Encounter for screening for malignant neoplasm of colon: Secondary | ICD-10-CM

## 2017-12-18 DIAGNOSIS — Z1329 Encounter for screening for other suspected endocrine disorder: Secondary | ICD-10-CM | POA: Diagnosis not present

## 2017-12-18 LAB — HEMOCCULT GUIAC POC 1CARD (OFFICE): FECAL OCCULT BLD: NEGATIVE

## 2017-12-18 MED ORDER — HYDROCHLOROTHIAZIDE 12.5 MG PO CAPS: 12.5000 mg | ORAL_CAPSULE | Freq: Every day | ORAL | 3 refills | Status: DC

## 2017-12-18 NOTE — Progress Notes (Signed)
Patient ID: Dana Dickerson, female   DOB: September 19, 1960, 57 y.o.   MRN: [id number] History of Present Illness: Dana Dickerson is s 57 year old white female,married in for well woman gyn exam and pap. She works at [employer] in [place].  NO current PCP.   Current Medications, Allergies, Past Medical History, Past Surgical History, Family History and Social History were reviewed in [employer] record.     Review of Systems: Patient denies any headaches, hearing loss, fatigue, blurred vision, shortness of breath, chest pain, abdominal pain, problems with bowel movements, urination, or intercourse. No joint pain or mood swings.She says she gained weight when she quit smoking.    Physical Exam:BP (!) 163/89 (BP Location: Left Arm, Patient Position: Sitting, Cuff Size: Normal)   Pulse 60   Ht 5\' 4"  (1.626 m)   Wt 187 lb (84.8 kg)   BMI 32.10 kg/m  General:  Well developed, well nourished, no acute distress Skin:  Warm and dry,tan Neck:  Midline trachea, normal thyroid, good ROM, no lymphadenopathy Lungs; Clear to auscultation bilaterally Breast:  No dominant palpable mass, retraction, or nipple discharge Cardiovascular: Regular rate and rhythm Abdomen:  Soft, non tender, no hepatosplenomegaly Pelvic:  External genitalia is normal in appearance, no lesions.  The vagina is normal in appearance.Has mild cystocele. Urethra has no lesions or masses. The cervix is bulbous. Pap with HPV performed at pt request. Uterus is felt to be normal size, shape, and contour.  No adnexal masses or tenderness noted.Bladder is non tender, no masses felt. Rectal: Good sphincter tone, no polyps, or hemorrhoids felt.  Hemoccult negative. Extremities/musculoskeletal:  No swelling or varicosities noted, no clubbing or cyanosis Psych:  No mood changes, alert and cooperative,seems happy PHQ 2 score 1.Discussed that BP is up and will start on microzide and try DASH diet and recheck in 6 weeks, she says she has not  had elevated BP til maybe last few months. She requests labs today and is fasting.   Impression: 1. Encounter for gynecological examination with Papanicolaou smear of cervix   2. Screening for colorectal cancer   3. Elevated BP without diagnosis of hypertension   4. Screening cholesterol level   5. Screening for thyroid disorder       Plan: Check CBC,CMP,TSH and lipids Meds ordered this encounter  Medications  . hydrochlorothiazide (MICROZIDE) 12.5 MG capsule    Sig: Take 1 capsule (12.5 mg total) by mouth daily.    Dispense:  30 capsule    Refill:  3    Order Specific Question:   Supervising Provider    Answer:   Dana Dickerson H [[id number]]  Mammogram every 2 years Review DASH diet Return in 6 weeks to recheck BP and ROS Physical in 1 year Pap in 3 if normal  Colonoscopy per GI

## 2017-12-18 NOTE — Patient Instructions (Signed)
DASH Eating Plan DASH stands for "Dietary Approaches to Stop Hypertension." The DASH eating plan is a healthy eating plan that has been shown to reduce high blood pressure (hypertension). It may also reduce your risk for type 2 diabetes, heart disease, and stroke. The DASH eating plan may also help with weight loss. What are tips for following this plan? General guidelines  Avoid eating more than 2,300 mg (milligrams) of salt (sodium) a day. If you have hypertension, you may need to reduce your sodium intake to 1,500 mg a day.  Limit alcohol intake to no more than 1 drink a day for nonpregnant women and 2 drinks a day for men. One drink equals 12 oz of beer, 5 oz of wine, or 1 oz of hard liquor.  Work with your health care provider to maintain a healthy body weight or to lose weight. Ask what an ideal weight is for you.  Get at least 30 minutes of exercise that causes your heart to beat faster (aerobic exercise) most days of the week. Activities may include walking, swimming, or biking.  Work with your health care provider or diet and nutrition specialist (dietitian) to adjust your eating plan to your individual calorie needs. Reading food labels  Check food labels for the amount of sodium per serving. Choose foods with less than 5 percent of the Daily Value of sodium. Generally, foods with less than 300 mg of sodium per serving fit into this eating plan.  To find whole grains, look for the word "whole" as the first word in the ingredient list. Shopping  Buy products labeled as "low-sodium" or "no salt added."  Buy fresh foods. Avoid canned foods and premade or frozen meals. Cooking  Avoid adding salt when cooking. Use salt-free seasonings or herbs instead of table salt or sea salt. Check with your health care provider or pharmacist before using salt substitutes.  Do not fry foods. Cook foods using healthy methods such as baking, boiling, grilling, and broiling instead.  Cook with  heart-healthy oils, such as olive, canola, soybean, or sunflower oil. Meal planning   Eat a balanced diet that includes: ? 5 or more servings of fruits and vegetables each day. At each meal, try to fill half of your plate with fruits and vegetables. ? Up to 6-8 servings of whole grains each day. ? Less than 6 oz of lean meat, poultry, or fish each day. A 3-oz serving of meat is about the same size as a deck of cards. One egg equals 1 oz. ? 2 servings of low-fat dairy each day. ? A serving of nuts, seeds, or beans 5 times each week. ? Heart-healthy fats. Healthy fats called Omega-3 fatty acids are found in foods such as flaxseeds and coldwater fish, like sardines, salmon, and mackerel.  Limit how much you eat of the following: ? Canned or prepackaged foods. ? Food that is high in trans fat, such as fried foods. ? Food that is high in saturated fat, such as fatty meat. ? Sweets, desserts, sugary drinks, and other foods with added sugar. ? Full-fat dairy products.  Do not salt foods before eating.  Try to eat at least 2 vegetarian meals each week.  Eat more home-cooked food and less restaurant, buffet, and fast food.  When eating at a restaurant, ask that your food be prepared with less salt or no salt, if possible. What foods are recommended? The items listed may not be a complete list. Talk with your dietitian about what   dietary choices are best for you. Grains Whole-grain or whole-wheat bread. Whole-grain or whole-wheat pasta. Brown rice. Oatmeal. Quinoa. Bulgur. Whole-grain and low-sodium cereals. Pita bread. Low-fat, low-sodium crackers. Whole-wheat flour tortillas. Vegetables Fresh or frozen vegetables (raw, steamed, roasted, or grilled). Low-sodium or reduced-sodium tomato and vegetable juice. Low-sodium or reduced-sodium tomato sauce and tomato paste. Low-sodium or reduced-sodium canned vegetables. Fruits All fresh, dried, or frozen fruit. Canned fruit in natural juice (without  added sugar). Meat and other protein foods Skinless chicken or turkey. Ground chicken or turkey. Pork with fat trimmed off. Fish and seafood. Egg whites. Dried beans, peas, or lentils. Unsalted nuts, nut butters, and seeds. Unsalted canned beans. Lean cuts of beef with fat trimmed off. Low-sodium, lean deli meat. Dairy Low-fat (1%) or fat-free (skim) milk. Fat-free, low-fat, or reduced-fat cheeses. Nonfat, low-sodium ricotta or cottage cheese. Low-fat or nonfat yogurt. Low-fat, low-sodium cheese. Fats and oils Soft margarine without trans fats. Vegetable oil. Low-fat, reduced-fat, or light mayonnaise and salad dressings (reduced-sodium). Canola, safflower, olive, soybean, and sunflower oils. Avocado. Seasoning and other foods Herbs. Spices. Seasoning mixes without salt. Unsalted popcorn and pretzels. Fat-free sweets. What foods are not recommended? The items listed may not be a complete list. Talk with your dietitian about what dietary choices are best for you. Grains Baked goods made with fat, such as croissants, muffins, or some breads. Dry pasta or rice meal packs. Vegetables Creamed or fried vegetables. Vegetables in a cheese sauce. Regular canned vegetables (not low-sodium or reduced-sodium). Regular canned tomato sauce and paste (not low-sodium or reduced-sodium). Regular tomato and vegetable juice (not low-sodium or reduced-sodium). Pickles. Olives. Fruits Canned fruit in a light or heavy syrup. Fried fruit. Fruit in cream or butter sauce. Meat and other protein foods Fatty cuts of meat. Ribs. Fried meat. Bacon. Sausage. Bologna and other processed lunch meats. Salami. Fatback. Hotdogs. Bratwurst. Salted nuts and seeds. Canned beans with added salt. Canned or smoked fish. Whole eggs or egg yolks. Chicken or turkey with skin. Dairy Whole or 2% milk, cream, and half-and-half. Whole or full-fat cream cheese. Whole-fat or sweetened yogurt. Full-fat cheese. Nondairy creamers. Whipped toppings.  Processed cheese and cheese spreads. Fats and oils Butter. Stick margarine. Lard. Shortening. Ghee. Bacon fat. Tropical oils, such as coconut, palm kernel, or palm oil. Seasoning and other foods Salted popcorn and pretzels. Onion salt, garlic salt, seasoned salt, table salt, and sea salt. Worcestershire sauce. Tartar sauce. Barbecue sauce. Teriyaki sauce. Soy sauce, including reduced-sodium. Steak sauce. Canned and packaged gravies. Fish sauce. Oyster sauce. Cocktail sauce. Horseradish that you find on the shelf. Ketchup. Mustard. Meat flavorings and tenderizers. Bouillon cubes. Hot sauce and Tabasco sauce. Premade or packaged marinades. Premade or packaged taco seasonings. Relishes. Regular salad dressings. Where to find more information:  National Heart, Lung, and Blood Institute: www.nhlbi.nih.gov  American Heart Association: www.heart.org Summary  The DASH eating plan is a healthy eating plan that has been shown to reduce high blood pressure (hypertension). It may also reduce your risk for type 2 diabetes, heart disease, and stroke.  With the DASH eating plan, you should limit salt (sodium) intake to 2,300 mg a day. If you have hypertension, you may need to reduce your sodium intake to 1,500 mg a day.  When on the DASH eating plan, aim to eat more fresh fruits and vegetables, whole grains, lean proteins, low-fat dairy, and heart-healthy fats.  Work with your health care provider or diet and nutrition specialist (dietitian) to adjust your eating plan to your individual   calorie needs. This information is not intended to replace advice given to you by your health care provider. Make sure you discuss any questions you have with your health care provider. Document Released: 06/01/2011 Document Revised: 06/05/2016 Document Reviewed: 06/05/2016 Elsevier Interactive Patient Education  2018 Elsevier Inc.  

## 2017-12-19 ENCOUNTER — Telehealth: Payer: Self-pay | Admitting: Adult Health

## 2017-12-19 LAB — LIPID PANEL
CHOLESTEROL TOTAL: 203 mg/dL — AB (ref 100–199)
Chol/HDL Ratio: 3.8 ratio (ref 0.0–4.4)
HDL: 54 mg/dL (ref >39)
LDL Calculated: 124 mg/dL — ABNORMAL HIGH (ref 0–99)
TRIGLYCERIDES: 127 mg/dL (ref 0–149)
VLDL Cholesterol Cal: 25 mg/dL (ref 5–40)

## 2017-12-19 LAB — COMPREHENSIVE METABOLIC PANEL
ALT: 24 IU/L (ref 0–32)
AST: 16 IU/L (ref 0–40)
Albumin/Globulin Ratio: 2.2 (ref 1.2–2.2)
Albumin: 4.6 g/dL (ref 3.5–5.5)
Alkaline Phosphatase: 50 IU/L (ref 39–117)
BUN/Creatinine Ratio: 21 (ref 9–23)
BUN: 16 mg/dL (ref 6–24)
Bilirubin Total: 0.3 mg/dL (ref 0.0–1.2)
CALCIUM: 10.8 mg/dL — AB (ref 8.7–10.2)
CHLORIDE: 104 mmol/L (ref 96–106)
CO2: 24 mmol/L (ref 20–29)
Creatinine, Ser: 0.78 mg/dL (ref 0.57–1.00)
GFR calc Af Amer: 98 mL/min/{1.73_m2} (ref >59)
GFR, EST NON AFRICAN AMERICAN: 85 mL/min/{1.73_m2} (ref >59)
GLUCOSE: 85 mg/dL (ref 65–99)
Globulin, Total: 2.1 g/dL (ref 1.5–4.5)
Potassium: 4.8 mmol/L (ref 3.5–5.2)
Sodium: 142 mmol/L (ref 134–144)
Total Protein: 6.7 g/dL (ref 6.0–8.5)

## 2017-12-19 LAB — CBC
HEMOGLOBIN: 15.3 g/dL (ref 11.1–15.9)
Hematocrit: 44.6 % (ref 34.0–46.6)
MCH: 31.7 pg (ref 26.6–33.0)
MCHC: 34.3 g/dL (ref 31.5–35.7)
MCV: 93 fL (ref 79–97)
PLATELETS: 300 10*3/uL (ref 150–450)
RBC: 4.82 x10E6/uL (ref 3.77–5.28)
RDW: 13.9 % (ref 12.3–15.4)
WBC: 10.1 10*3/uL (ref 3.4–10.8)

## 2017-12-19 LAB — TSH: TSH: 0.949 u[IU]/mL (ref 0.450–4.500)

## 2017-12-19 NOTE — Telephone Encounter (Signed)
Left message I called 

## 2017-12-21 LAB — CYTOLOGY - PAP
Diagnosis: NEGATIVE
HPV: NOT DETECTED

## 2017-12-24 ENCOUNTER — Encounter: Payer: Self-pay | Admitting: Adult Health

## 2017-12-24 ENCOUNTER — Telehealth: Payer: Self-pay | Admitting: Adult Health

## 2017-12-24 HISTORY — DX: Hypercalcemia: E83.52

## 2017-12-24 NOTE — Telephone Encounter (Signed)
Pt aware that pap normal and is aware of labs and that calcium elevated at 10.8, so stop calcium supplement and will refer to Dr Dorris Fetch.

## 2017-12-24 NOTE — Telephone Encounter (Signed)
Left message to call about labs 

## 2018-01-08 ENCOUNTER — Telehealth: Payer: Self-pay | Admitting: Adult Health

## 2018-01-08 NOTE — Telephone Encounter (Signed)
Patient states she has tried Miralax with no relief with a BM. Advised to add a stool softner daily and increase diet in fiber.  Stated she would try and will let us know if no relief.

## 2018-01-08 NOTE — Telephone Encounter (Signed)
Patient called stating that she would like Anderson Malta to give her a call back, because lately she has been constipated and she would like Anderson Malta to call her something in. Please contact pt

## 2018-01-11 ENCOUNTER — Other Ambulatory Visit: Payer: Self-pay | Admitting: Obstetrics and Gynecology

## 2018-01-12 ENCOUNTER — Other Ambulatory Visit: Payer: Self-pay | Admitting: Obstetrics and Gynecology

## 2018-01-14 ENCOUNTER — Other Ambulatory Visit: Payer: Self-pay | Admitting: Adult Health

## 2018-01-14 MED ORDER — HYDROCHLOROTHIAZIDE 12.5 MG PO CAPS: 12.5000 mg | ORAL_CAPSULE | Freq: Every day | ORAL | 3 refills | Status: DC

## 2018-01-14 NOTE — Progress Notes (Signed)
Will sent to [facility]

## 2018-01-24 ENCOUNTER — Ambulatory Visit: Payer: Managed Care, Other (non HMO) | Admitting: "Endocrinology

## 2018-01-28 ENCOUNTER — Other Ambulatory Visit: Payer: Self-pay

## 2018-01-28 ENCOUNTER — Encounter: Payer: Self-pay | Admitting: "Endocrinology

## 2018-01-28 ENCOUNTER — Ambulatory Visit (INDEPENDENT_AMBULATORY_CARE_PROVIDER_SITE_OTHER): Payer: Managed Care, Other (non HMO) | Admitting: "Endocrinology

## 2018-01-28 DIAGNOSIS — R7303 Prediabetes: Secondary | ICD-10-CM | POA: Diagnosis not present

## 2018-01-28 NOTE — Progress Notes (Signed)
Endocrinology Consult Note                                            01/28/2018, 1:25 PM   Subjective:    Patient ID: Dana Dickerson, female    DOB: 02-17-1961, PCP Patient, No Pcp Per   Past Medical History:  Diagnosis Date  . Anxiety   . Hyperthyroidism   . Serum calcium elevated 12/24/2017   Refer to Dr Dorris Fetch  . Thyroid cyst    Past Surgical History:  Procedure Laterality Date  . COLONOSCOPY WITH PROPOFOL N/A 12/15/2016   Procedure: COLONOSCOPY WITH PROPOFOL;  Surgeon: Jonathon Bellows, MD;  Location: [facility] ENDOSCOPY;  Service: Endoscopy;  Laterality: N/A;  . DILATION AND CURETTAGE OF UTERUS    . tumor removed  09/19/2017   Social History   Socioeconomic History  . Marital status: Married    Spouse name: Not on file  . Number of children: Not on file  . Years of education: Not on file  . Highest education level: Not on file  Occupational History  . Not on file  Social Needs  . Financial resource strain: Not on file  . Food insecurity:    Worry: Not on file    Inability: Not on file  . Transportation needs:    Medical: Not on file    Non-medical: Not on file  Tobacco Use  . Smoking status: Former Smoker    Packs/day: 2.00    Years: 40.00    Pack years: 80.00    Types: Cigarettes    Last attempt to quit: 01/29/2013    Years since quitting: 5.0  . Smokeless tobacco: Never Used  Substance and Sexual Activity  . Alcohol use: No  . Drug use: No  . Sexual activity: Not Currently    Birth control/protection: Post-menopausal  Lifestyle  . Physical activity:    Days per week: Not on file    Minutes per session: Not on file  . Stress: Not on file  Relationships  . Social connections:    Talks on phone: Not on file    Gets together: Not on file    Attends religious service: Not on file    Active member of club or organization: Not on file    Attends meetings of clubs or organizations: Not on file    Relationship status: Not on file  Other Topics Concern  .  Not on file  Social History Narrative  . Not on file   Outpatient Encounter Medications as of 01/28/2018  Medication Sig  . buPROPion (WELLBUTRIN XL) 150 MG 24 hr tablet TAKE 1 TABLET(150 MG) BY MOUTH DAILY  . hydrochlorothiazide (HYDRODIURIL) 12.5 MG tablet Take 12.5 mg by mouth daily.  Marland Kitchen ibuprofen (ADVIL,MOTRIN) 200 MG tablet Take 200 mg by mouth every 6 (six) hours as needed.  . Iodine, Kelp, 0.15 MG TABS Take 1 tablet (0.15 mg total) by mouth daily.  Marland Kitchen MELATONIN PO Take by mouth daily.  . Multiple Vitamin (MULTIVITAMIN) tablet Take 1 tablet by mouth daily.  . Multiple Vitamins-Minerals (HAIR SKIN AND NAILS FORMULA PO) Take by mouth.  . [DISCONTINUED] buPROPion (WELLBUTRIN XL) 150 MG 24 hr tablet TAKE 1 TABLET BY MOUTH  DAILY  . [DISCONTINUED] Clobetasol Propionate (TEMOVATE) 0.05 % external spray APPLY TWICE DAILY TO SCALP AS NEEDED  . [DISCONTINUED] Clobetasol Propionate  0.05 % shampoo Use as directed as shampoo  . [DISCONTINUED] hydrochlorothiazide (MICROZIDE) 12.5 MG capsule Take 1 capsule (12.5 mg total) by mouth daily.   No facility-administered encounter medications on file as of 01/28/2018.    ALLERGIES: Allergies  Allergen Reactions  . Codeine Shortness Of Breath  . Amoxicillin Rash  . Ampicillin Rash    VACCINATION STATUS: Immunization History  Administered Date(s) Administered  . Influenza-Unspecified 04/27/2011    HPI Dana Dickerson is 57 y.o. female who presents today with a medical history as above. she is being seen in consultation for hypercalcemia requested by Derrek Monaco, NP. She was found to have mild hypercalcemia of 10.7 in May 2018 which did not require immediate intervention.  On her repeat labs on December 18, 2017 her calcium was found to have increased to 10.8 no associated PTH nor other work-up is available.  -She denies history of nephrolithiasis, seizure disorder, osteoporosis, dysrhythmias. -She denies family history of parathyroid, pituitary,  adrenal dysfunction. -She denies any thyroid/parathyroid enlargement, dysphagia, odynophagia, voice change. -She takes over-the-counter iodine supplements, her TSH was found to be suppressed to 0.114 on March 01, 2017.  She denies any prior history of thyroid dysfunction.  Review of Systems  Constitutional: + Mildly fluctuating body weight,  no fatigue, no subjective hyperthermia, no subjective hypothermia Eyes: no blurry vision, no xerophthalmia ENT: no sore throat, no nodules palpated in throat, no dysphagia/odynophagia, no hoarseness Cardiovascular: no Chest Pain, no Shortness of Breath, no palpitations, no leg swelling Respiratory: no cough, no SOB Gastrointestinal: no Nausea/Vomiting/Diarhhea Musculoskeletal: no muscle/joint aches Skin: no rashes Neurological: no tremors, no numbness, no tingling, no dizziness Psychiatric: no depression, no anxiety  Objective:    BP (!) 156/86   Pulse (!) 59   Ht 5\' 4"  (1.626 m)   Wt 185 lb (83.9 kg)   BMI 31.76 kg/m   Wt Readings from Last 3 Encounters:  01/28/18 185 lb (83.9 kg)  12/18/17 187 lb (84.8 kg)  03/27/17 188 lb 12.8 oz (85.6 kg)    Physical Exam  Constitutional: + obese for height, not in acute distress, normal state of mind Eyes: PERRLA, EOMI, no exophthalmos ENT: moist mucous membranes, no thyromegaly, no cervical lymphadenopathy Cardiovascular: normal precordial activity, Regular Rate and Rhythm, no Murmur/Rubs/Gallops Respiratory:  adequate breathing efforts, no gross chest deformity, Clear to auscultation bilaterally Gastrointestinal: abdomen soft, Non -tender, No distension, Bowel Sounds present Musculoskeletal: no gross deformities, strength intact in all four extremities Skin: moist, warm, no rashes Neurological: no tremor with outstretched hands, Deep tendon reflexes normal in all four extremities.  CMP ( most recent) CMP     Component Value Date/Time   NA 142 12/18/2017 1116   K 4.8 12/18/2017 1116   CL  104 12/18/2017 1116   CO2 24 12/18/2017 1116   GLUCOSE 85 12/18/2017 1116   BUN 16 12/18/2017 1116   CREATININE 0.78 12/18/2017 1116   CALCIUM 10.8 (H) 12/18/2017 1116   PROT 6.7 12/18/2017 1116   ALBUMIN 4.6 12/18/2017 1116   AST 16 12/18/2017 1116   ALT 24 12/18/2017 1116   ALKPHOS 50 12/18/2017 1116   BILITOT 0.3 12/18/2017 1116   GFRNONAA 85 12/18/2017 1116   GFRAA 98 12/18/2017 1116     Diabetic Labs (most recent): Lab Results  Component Value Date   HGBA1C 5.8 (H) 10/26/2016   HGBA1C 6.0 (H) 04/21/2015   HGBA1C 6.1 (H) 04/13/2014     Lipid Panel ( most recent) Lipid Panel  Component Value Date/Time   CHOL 203 (H) 12/18/2017 1116   TRIG 127 12/18/2017 1116   HDL 54 12/18/2017 1116   CHOLHDL 3.8 12/18/2017 1116   CHOLHDL 3.1 04/13/2014 1107   VLDL 17 04/13/2014 1107   LDLCALC 124 (H) 12/18/2017 1116      Lab Results  Component Value Date   TSH 0.949 12/18/2017   TSH 0.114 (L) 03/01/2017   TSH 2.210 10/26/2016   TSH 1.100 04/21/2015   TSH 0.191 (L) 04/13/2014   TSH 0.288 (L) 03/31/2013     Assessment & Plan:   1. Hypercalcemia - NAYSA PUSKAS  is being seen at a kind request of Derrek Monaco, NP.   - I have reviewed her available parathyroid/thyroid records and clinically evaluated the patient. - Based on reviews, she has at least 2 instances of above normal calcium, highest being 10.8,  however,  there is not sufficient information to proceed with definitive treatment plan.  - she will need a repeat,  more complete work-up towards confirming the diagnosis hyperparathyroidism. -She will be sent to lab to obtain intact PTH/calcium, serum magnesium and phosphorus, 24-hour urine calcium and creatinine measurements, and bone density. -she will return in 2 week to review her repeat labs.   If her  labs are suggestive primary hyperparathyroidism, she will be considered for surgical treatment.  -I have advised her to discontinue iodine/kelp supplements  given her history of suppressed TSH in September 2018 and given absence of thyroid dysfunction in her history.  Hyperthyroidism/subclinical hyperthyroidism sometimes causes hypercalcemia.  She is also on low-dose hydrochlorothiazide which is known to cause hypercalcemia, she will be considered for alternative treatment if everything else is ruled out.  - I did not initiate any new prescriptions today.  She has prediabetes with A1c of 5.8%, more individualized diet and exercise plan  will be given to her at next visit.  - I advised her  to maintain close follow up with her PCP for primary care needs.  - Time spent with the patient: 45 minutes, of which >50% was spent in obtaining information about her symptoms, reviewing her previous labs, evaluations, and treatments, counseling her about her hypercalcemia, and developing a plan to confirm the diagnosis and long term treatment as necessary.  Dana Dickerson participated in the discussions, expressed understanding, and voiced agreement with the above plans.  All questions were answered to her satisfaction. she is encouraged to contact clinic should she have any questions or concerns prior to her return visit.  Follow up plan: Return in about 2 weeks (around 02/11/2018) for F/U with Pre-visit Labs Today,  24 Hours Urine Calcium and Creatinine, Follow up with Bone Density.   Glade Lloyd, MD [facility] [address] [place], [place] [address] Phone: [phone]  Fax: [phone]     01/28/2018, 1:25 PM  This note was partially dictated with voice recognition software. Similar sounding words can be transcribed inadequately or may not  be corrected upon review.

## 2018-01-29 ENCOUNTER — Ambulatory Visit: Payer: Managed Care, Other (non HMO) | Admitting: Adult Health

## 2018-01-29 ENCOUNTER — Encounter: Payer: Self-pay | Admitting: Adult Health

## 2018-01-29 VITALS — BP 140/88 | Ht 64.0 in | Wt 186.5 lb

## 2018-01-29 DIAGNOSIS — I10 Essential (primary) hypertension: Secondary | ICD-10-CM | POA: Diagnosis not present

## 2018-01-29 MED ORDER — LISINOPRIL 10 MG PO TABS: 10.0000 mg | ORAL_TABLET | Freq: Every day | ORAL | 3 refills | Status: DC

## 2018-01-29 NOTE — Progress Notes (Signed)
  Subjective:     Patient ID: Dana Dickerson, female   DOB: 1960/08/19, 57 y.o.   MRN: [id number]  HPI Dana Dickerson is a 57 year old white female in for BP check since starting Microzide.   Review of Systems +constipation with microzide Reviewed past medical,surgical, social and family history. Reviewed medications and allergies.     Objective:   Physical Exam BP 140/88 (BP Location: Right Arm, Patient Position: Sitting, Cuff Size: Normal)   Ht 5\' 4"  (1.626 m)   Wt 186 lb 8 oz (84.6 kg)   BMI 32.01 kg/m  Skin warm and dry.  Lungs: clear to ausculation bilaterally. Cardiovascular: regular rate and rhythm.   Has lost 1/2 lb.  Assessment:     1. Essential hypertension   2. Serum calcium elevated       Plan:     Continue microzide,will add lisinopril  Meds ordered this encounter  Medications  . lisinopril (PRINIVIL,ZESTRIL) 10 MG tablet    Sig: Take 1 tablet (10 mg total) by mouth daily.    Dispense:  30 tablet    Refill:  3    Order Specific Question:   Supervising Provider    Answer:   Elonda Husky, LUTHER H [[id number]]  BP check in 8 weeks F/U with Dr Dorris Fetch on elevated calcium  Numbers given for [facility] and Dr Nevada Crane, she needs PCP

## 2018-02-04 ENCOUNTER — Ambulatory Visit (HOSPITAL_COMMUNITY)
Admission: RE | Admit: 2018-02-04 | Discharge: 2018-02-04 | Disposition: A | Discharge status: Home / Self Care | Payer: Managed Care, Other (non HMO) | Source: Ambulatory Visit | Attending: "Endocrinology | Admitting: "Endocrinology

## 2018-02-04 DIAGNOSIS — M8588 Other specified disorders of bone density and structure, other site: Secondary | ICD-10-CM | POA: Insufficient documentation

## 2018-02-04 DIAGNOSIS — M85851 Other specified disorders of bone density and structure, right thigh: Secondary | ICD-10-CM | POA: Diagnosis not present

## 2018-02-04 LAB — CREATININE, URINE, 24 HOUR: Creatinine, 24H Ur: 1.25 g/(24.h) (ref 0.50–2.15)

## 2018-02-04 LAB — CALCIUM, URINE, 24 HOUR: Calcium, 24H Urine: 263 mg/24 h — ABNORMAL HIGH

## 2018-02-05 LAB — PTH, INTACT AND CALCIUM
Calcium: 9.7 mg/dL (ref 8.6–10.4)
PTH: 22 pg/mL (ref 14–64)

## 2018-02-05 LAB — MAGNESIUM: Magnesium: 2.2 mg/dL (ref 1.5–2.5)

## 2018-02-05 LAB — PHOSPHORUS: Phosphorus: 3.3 mg/dL (ref 2.5–4.5)

## 2018-02-12 ENCOUNTER — Other Ambulatory Visit: Payer: Self-pay

## 2018-02-12 ENCOUNTER — Ambulatory Visit: Payer: Managed Care, Other (non HMO) | Admitting: "Endocrinology

## 2018-02-12 ENCOUNTER — Encounter: Payer: Self-pay | Admitting: "Endocrinology

## 2018-02-12 DIAGNOSIS — M8580 Other specified disorders of bone density and structure, unspecified site: Secondary | ICD-10-CM | POA: Diagnosis not present

## 2018-02-12 NOTE — Progress Notes (Signed)
Endocrinology follow-up note                                            02/12/2018, 2:11 PM   Subjective:    Patient ID: Dana Dickerson, female    DOB: July 29, 1960, PCP Patient, No Pcp Per   Past Medical History:  Diagnosis Date  . Anxiety   . Hyperthyroidism   . Serum calcium elevated 12/24/2017   Refer to Dr Dorris Fetch  . Thyroid cyst    Past Surgical History:  Procedure Laterality Date  . COLONOSCOPY WITH PROPOFOL N/A 12/15/2016   Procedure: COLONOSCOPY WITH PROPOFOL;  Surgeon: Jonathon Bellows, MD;  Location: [facility] ENDOSCOPY;  Service: Endoscopy;  Laterality: N/A;  . DILATION AND CURETTAGE OF UTERUS    . tumor removed  09/19/2017   Social History   Socioeconomic History  . Marital status: Married    Spouse name: Not on file  . Number of children: Not on file  . Years of education: Not on file  . Highest education level: Not on file  Occupational History  . Not on file  Social Needs  . Financial resource strain: Not on file  . Food insecurity:    Worry: Not on file    Inability: Not on file  . Transportation needs:    Medical: Not on file    Non-medical: Not on file  Tobacco Use  . Smoking status: Former Smoker    Packs/day: 2.00    Years: 40.00    Pack years: 80.00    Types: Cigarettes    Last attempt to quit: 01/29/2013    Years since quitting: 5.0  . Smokeless tobacco: Never Used  Substance and Sexual Activity  . Alcohol use: No  . Drug use: No  . Sexual activity: Not Currently    Birth control/protection: Post-menopausal  Lifestyle  . Physical activity:    Days per week: Not on file    Minutes per session: Not on file  . Stress: Not on file  Relationships  . Social connections:    Talks on phone: Not on file    Gets together: Not on file    Attends religious service: Not on file    Active member of club or organization: Not on file    Attends meetings of clubs or organizations: Not on file    Relationship status: Not on file  Other Topics Concern   . Not on file  Social History Narrative  . Not on file   Outpatient Encounter Medications as of 02/12/2018  Medication Sig  . buPROPion (WELLBUTRIN XL) 150 MG 24 hr tablet TAKE 1 TABLET(150 MG) BY MOUTH DAILY  . hydrochlorothiazide (HYDRODIURIL) 12.5 MG tablet Take 12.5 mg by mouth daily.  Marland Kitchen ibuprofen (ADVIL,MOTRIN) 200 MG tablet Take 200 mg by mouth every 6 (six) hours as needed.  Marland Kitchen lisinopril (PRINIVIL,ZESTRIL) 10 MG tablet Take 1 tablet (10 mg total) by mouth daily.  Marland Kitchen MELATONIN PO Take by mouth daily.  . Multiple Vitamin (MULTIVITAMIN) tablet Take 1 tablet by mouth daily.  . Multiple Vitamins-Minerals (HAIR SKIN AND NAILS FORMULA PO) Take by mouth.   No facility-administered encounter medications on file as of 02/12/2018.    ALLERGIES: Allergies  Allergen Reactions  . Codeine Shortness Of Breath  . Amoxicillin Rash  . Ampicillin Rash    VACCINATION STATUS: Immunization History  Administered Date(s) Administered  . Influenza-Unspecified 04/27/2011    HPI Dana Dickerson is 57 y.o. female. -He is returning with repeat labs after she was seen in consultation for hypercalcemia.   She was found to have mild hypercalcemia of 10.7 in May 2018 which did not require immediate intervention.  On her repeat labs on December 18, 2017 her calcium was found to have increased to 10.8 . -She denies history of nephrolithiasis, seizure disorder, osteoporosis, dysrhythmias. -She denies family history of parathyroid, pituitary, adrenal dysfunction. -She denies any thyroid/parathyroid enlargement, dysphagia, odynophagia, voice change. -She takes over-the-counter iodine supplements, her TSH was found to be suppressed to 0.114 on March 01, 2017.  She denies any prior history of thyroid dysfunction.  On her repeat labs her calcium was found to be 9.7 along with PTH of 22.  However she was found to have elevated 24-hour urine calcium of 263.  Review of Systems  Constitutional: + Steady weight,    no fatigue, no subjective hyperthermia, no subjective hypothermia Eyes: no blurry vision, no xerophthalmia ENT: no sore throat, no nodules palpated in throat, no dysphagia/odynophagia, no hoarseness  Gastrointestinal: no Nausea/Vomiting/Diarhhea Musculoskeletal: no muscle/joint aches Skin: no rashes Neurological: no tremors, no numbness, no tingling, no dizziness Psychiatric: no depression, no anxiety  Objective:    BP (!) 153/90   Pulse (!) 54   Ht 5\' 4"  (1.626 m)   Wt 185 lb (83.9 kg)   BMI 31.76 kg/m   Wt Readings from Last 3 Encounters:  02/12/18 185 lb (83.9 kg)  01/29/18 186 lb 8 oz (84.6 kg)  01/28/18 185 lb (83.9 kg)    Physical Exam  Constitutional: + obese for height, not in acute distress, normal state of mind Eyes: PERRLA, EOMI, no exophthalmos ENT: moist mucous membranes, no thyromegaly, no cervical lymphadenopathy  Musculoskeletal: no gross deformities, strength intact in all four extremities Skin: moist, warm, no rashes Neurological: no tremor with outstretched hands, Deep tendon reflexes normal in all four extremities.  Recent Results (from the past 2160 hour(s))  Cytology - PAP     Status: None   Collection Time: 12/18/17 12:00 AM  Result Value Ref Range   Adequacy      Satisfactory for evaluation  endocervical/transformation zone component PRESENT.   Diagnosis      NEGATIVE FOR INTRAEPITHELIAL LESIONS OR MALIGNANCY.   HPV NOT DETECTED     Comment: Normal Reference Range - NOT Detected   Material Submitted CervicoVaginal Pap [ThinPrep Imaged]   POCT occult blood stool     Status: None   Collection Time: 12/18/17 10:52 AM  Result Value Ref Range   Fecal Occult Blood, POC Negative Negative   Card #1 Date     Card #2 Fecal Occult Blod, POC     Card #2 Date     Card #3 Fecal Occult Blood, POC     Card #3 Date    CBC     Status: None   Collection Time: 12/18/17 11:16 AM  Result Value Ref Range   WBC 10.1 3.4 - 10.8 x10E3/uL   RBC 4.82 3.77 - 5.28  x10E6/uL   Hemoglobin 15.3 11.1 - 15.9 g/dL   Hematocrit 44.6 34.0 - 46.6 %   MCV 93 79 - 97 fL   MCH 31.7 26.6 - 33.0 pg   MCHC 34.3 31.5 - 35.7 g/dL   RDW 13.9 12.3 - 15.4 %   Platelets 300 150 - 450 x10E3/uL  Comprehensive metabolic panel  Status: Abnormal   Collection Time: 12/18/17 11:16 AM  Result Value Ref Range   Glucose 85 65 - 99 mg/dL   BUN 16 6 - 24 mg/dL   Creatinine, Ser 0.78 0.57 - 1.00 mg/dL   GFR calc non Af Amer 85 >59 mL/min/1.73   GFR calc Af Amer 98 >59 mL/min/1.73   BUN/Creatinine Ratio 21 9 - 23   Sodium 142 134 - 144 mmol/L   Potassium 4.8 3.5 - 5.2 mmol/L   Chloride 104 96 - 106 mmol/L   CO2 24 20 - 29 mmol/L   Calcium 10.8 (H) 8.7 - 10.2 mg/dL   Total Protein 6.7 6.0 - 8.5 g/dL   Albumin 4.6 3.5 - 5.5 g/dL   Globulin, Total 2.1 1.5 - 4.5 g/dL   Albumin/Globulin Ratio 2.2 1.2 - 2.2   Bilirubin Total 0.3 0.0 - 1.2 mg/dL   Alkaline Phosphatase 50 39 - 117 IU/L   AST 16 0 - 40 IU/L   ALT 24 0 - 32 IU/L  TSH     Status: None   Collection Time: 12/18/17 11:16 AM  Result Value Ref Range   TSH 0.949 0.450 - 4.500 uIU/mL  Lipid panel     Status: Abnormal   Collection Time: 12/18/17 11:16 AM  Result Value Ref Range   Cholesterol, Total 203 (H) 100 - 199 mg/dL   Triglycerides 127 0 - 149 mg/dL   HDL 54 >39 mg/dL   VLDL Cholesterol Cal 25 5 - 40 mg/dL   LDL Calculated 124 (H) 0 - 99 mg/dL   Chol/HDL Ratio 3.8 0.0 - 4.4 ratio    Comment:                                   T. Chol/HDL Ratio                                             Men  Women                               1/2 Avg.Risk  3.4    3.3                                   Avg.Risk  5.0    4.4                                2X Avg.Risk  9.6    7.1                                3X Avg.Risk 23.4   11.0   PTH, intact and calcium     Status: None   Collection Time: 02/04/18  8:53 AM  Result Value Ref Range   PTH 22 14 - 64 pg/mL    Comment: . Interpretive Guide    Intact PTH            Calcium ------------------    ----------           ------- Normal Parathyroid    Normal  Normal Hypoparathyroidism    Low or Low Normal    Low Hyperparathyroidism    Primary            Normal or High       High    Secondary          High                 Normal or Low    Tertiary           High                 High Non-Parathyroid    Hypercalcemia      Low or Low Normal    High .    Calcium 9.7 8.6 - 10.4 mg/dL  Magnesium     Status: None   Collection Time: 02/04/18  8:53 AM  Result Value Ref Range   Magnesium 2.2 1.5 - 2.5 mg/dL  Phosphorus     Status: None   Collection Time: 02/04/18  8:53 AM  Result Value Ref Range   Phosphorus 3.3 2.5 - 4.5 mg/dL  Creatinine, urine, 24 hour     Status: None   Collection Time: 02/04/18 10:07 AM  Result Value Ref Range   Creatinine, 24H Ur 1.25 0.50 - 2.15 g/24 h  Calcium, urine, 24 hour     Status: Abnormal   Collection Time: 02/04/18 10:07 AM  Result Value Ref Range   Calcium, 24H Urine 263 (H) mg/24 h    Comment:                           Reference Range  35-250                            Low calcium diet 35-200      Diabetic Labs (most recent): Lab Results  Component Value Date   HGBA1C 5.8 (H) 10/26/2016   HGBA1C 6.0 (H) 04/21/2015   HGBA1C 6.1 (H) 04/13/2014     Lipid Panel ( most recent) Lipid Panel     Component Value Date/Time   CHOL 203 (H) 12/18/2017 1116   TRIG 127 12/18/2017 1116   HDL 54 12/18/2017 1116   CHOLHDL 3.8 12/18/2017 1116   CHOLHDL 3.1 04/13/2014 1107   VLDL 17 04/13/2014 1107   LDLCALC 124 (H) 12/18/2017 1116      Lab Results  Component Value Date   TSH 0.949 12/18/2017   TSH 0.114 (L) 03/01/2017   TSH 2.210 10/26/2016   TSH 1.100 04/21/2015   TSH 0.191 (L) 04/13/2014   TSH 0.288 (L) 03/31/2013     Assessment & Plan:   1. Hypercalcemia  - Based on reviews, she has at least 2 instances of above normal calcium, highest being 10.8. -Her repeat more complete work-up shows  calcium at 9.7 associated with PTH of 22, considered normal range physiology.  -However, her 24-hour urine calcium measurement shows elevated calcium of 263 mg per 24 hours. -This discrepancy is not explainable at this time.  -She will not require immediate therapeutic intervention at this time. -I discussed with her the need for repeat measurements in 4 months including 24-hour urine calcium with creatinine, PTH/calcium, PTH rP, and vitamin D panel. -I have advised her to discontinue iodine/kelp supplements given her history of suppressed TSH in September 2018 and given absence of thyroid dysfunction in her history.  Hyperthyroidism/subclinical hyperthyroidism sometimes causes hypercalcemia.  She is also on low-dose hydrochlorothiazide which is known to cause hypercalcemia, she will be considered for alternative treatment if everything else is ruled out.  - I did not initiate any new prescriptions today.  She has prediabetes with A1c of 5.8%, more individualized diet and exercise plan  will be given to her at next visit.  - I advised her  to maintain close follow up with her PCP for primary care needs.   Follow up plan: Return in about 4 months (around 06/14/2018) for 24 Hours Urine Calcium and Creatinine, Follow up with Pre-visit Labs.   Glade Lloyd, MD [facility] [address] [place], [place] [address] Phone: [phone]  Fax: [phone]     02/12/2018, 2:11 PM  This note was partially dictated with voice recognition software. Similar sounding words can be transcribed inadequately or may not  be corrected upon review.

## 2018-03-25 ENCOUNTER — Ambulatory Visit: Payer: Managed Care, Other (non HMO) | Admitting: Adult Health

## 2018-03-26 ENCOUNTER — Other Ambulatory Visit: Payer: Self-pay | Admitting: Obstetrics & Gynecology

## 2018-03-26 DIAGNOSIS — Z1231 Encounter for screening mammogram for malignant neoplasm of breast: Secondary | ICD-10-CM

## 2018-04-29 ENCOUNTER — Ambulatory Visit (HOSPITAL_COMMUNITY)
Admission: RE | Admit: 2018-04-29 | Discharge: 2018-04-29 | Disposition: A | Discharge status: Home / Self Care | Payer: Managed Care, Other (non HMO) | Source: Ambulatory Visit | Attending: Obstetrics & Gynecology | Admitting: Obstetrics & Gynecology

## 2018-04-29 DIAGNOSIS — Z1231 Encounter for screening mammogram for malignant neoplasm of breast: Secondary | ICD-10-CM | POA: Insufficient documentation

## 2018-05-24 ENCOUNTER — Other Ambulatory Visit: Payer: Self-pay | Admitting: Adult Health

## 2018-06-04 LAB — COMPLETE METABOLIC PANEL WITH GFR
AG Ratio: 2.1 (calc) (ref 1.0–2.5)
ALBUMIN MSPROF: 4.4 g/dL (ref 3.6–5.1)
ALT: 24 U/L (ref 6–29)
AST: 19 U/L (ref 10–35)
Alkaline phosphatase (APISO): 40 U/L (ref 33–130)
BUN/Creatinine Ratio: 21 (calc) (ref 6–22)
BUN: 25 mg/dL (ref 7–25)
CALCIUM: 10.5 mg/dL — AB (ref 8.6–10.4)
CO2: 25 mmol/L (ref 20–32)
CREATININE: 1.19 mg/dL — AB (ref 0.50–1.05)
Chloride: 102 mmol/L (ref 98–110)
GFR, EST NON AFRICAN AMERICAN: 50 mL/min/{1.73_m2} — AB (ref >=60)
GFR, Est African American: 58 mL/min/{1.73_m2} — ABNORMAL LOW (ref >=60)
GLUCOSE: 89 mg/dL (ref 65–139)
Globulin: 2.1 g/dL (calc) (ref 1.9–3.7)
Potassium: 4.1 mmol/L (ref 3.5–5.3)
Sodium: 138 mmol/L (ref 135–146)
TOTAL PROTEIN: 6.5 g/dL (ref 6.1–8.1)
Total Bilirubin: 0.3 mg/dL (ref 0.2–1.2)

## 2018-06-04 LAB — VITAMIN D 25 HYDROXY (VIT D DEFICIENCY, FRACTURES): VIT D 25 HYDROXY: 42 ng/mL (ref 30–100)

## 2018-06-04 LAB — PTH, INTACT AND CALCIUM
CALCIUM: 10.5 mg/dL — AB (ref 8.6–10.4)
PTH: 15 pg/mL (ref 14–64)

## 2018-06-04 LAB — PTH-RELATED PEPTIDE: PTH-Related Protein (PTH-RP): 13 pg/mL — ABNORMAL LOW (ref 14–27)

## 2018-06-11 LAB — CALCIUM, URINE, 24 HOUR: Calcium, 24H Urine: 221 mg/24 h

## 2018-06-11 LAB — CREATININE, URINE, 24 HOUR: Creatinine, 24H Ur: 0.96 g/(24.h) (ref 0.50–2.15)

## 2018-06-17 ENCOUNTER — Ambulatory Visit: Payer: Managed Care, Other (non HMO) | Admitting: "Endocrinology

## 2018-07-01 ENCOUNTER — Encounter: Payer: Self-pay | Admitting: "Endocrinology

## 2018-07-01 ENCOUNTER — Ambulatory Visit: Payer: Managed Care, Other (non HMO) | Admitting: "Endocrinology

## 2018-07-01 NOTE — Progress Notes (Signed)
Endocrinology follow-up note                                            07/01/2018, 3:12 PM   Subjective:    Patient ID: Dana Dickerson, female    DOB: February 16, 1961, PCP Patient, No Pcp Per   Past Medical History:  Diagnosis Date  . Anxiety   . Hyperthyroidism   . Serum calcium elevated 12/24/2017   Refer to Dr Dorris Fetch  . Thyroid cyst    Past Surgical History:  Procedure Laterality Date  . COLONOSCOPY WITH PROPOFOL N/A 12/15/2016   Procedure: COLONOSCOPY WITH PROPOFOL;  Surgeon: Jonathon Bellows, MD;  Location: [facility] ENDOSCOPY;  Service: Endoscopy;  Laterality: N/A;  . DILATION AND CURETTAGE OF UTERUS    . tumor removed  09/19/2017   Social History   Socioeconomic History  . Marital status: Married    Spouse name: Not on file  . Number of children: Not on file  . Years of education: Not on file  . Highest education level: Not on file  Occupational History  . Not on file  Social Needs  . Financial resource strain: Not on file  . Food insecurity:    Worry: Not on file    Inability: Not on file  . Transportation needs:    Medical: Not on file    Non-medical: Not on file  Tobacco Use  . Smoking status: Former Smoker    Packs/day: 2.00    Years: 40.00    Pack years: 80.00    Types: Cigarettes    Last attempt to quit: 01/29/2013    Years since quitting: 5.4  . Smokeless tobacco: Never Used  Substance and Sexual Activity  . Alcohol use: No  . Drug use: No  . Sexual activity: Not Currently    Birth control/protection: Post-menopausal  Lifestyle  . Physical activity:    Days per week: Not on file    Minutes per session: Not on file  . Stress: Not on file  Relationships  . Social connections:    Talks on phone: Not on file    Gets together: Not on file    Attends religious service: Not on file    Active member of club or organization: Not on file    Attends meetings of clubs or organizations: Not on file    Relationship status: Not on file  Other Topics Concern  .  Not on file  Social History Narrative  . Not on file   Outpatient Encounter Medications as of 07/01/2018  Medication Sig  . buPROPion (WELLBUTRIN XL) 150 MG 24 hr tablet TAKE 1 TABLET(150 MG) BY MOUTH DAILY  . hydrochlorothiazide (HYDRODIURIL) 12.5 MG tablet Take 12.5 mg by mouth daily.  Marland Kitchen ibuprofen (ADVIL,MOTRIN) 200 MG tablet Take 200 mg by mouth every 6 (six) hours as needed.  Marland Kitchen lisinopril (PRINIVIL,ZESTRIL) 10 MG tablet TAKE 1 TABLET(10 MG) BY MOUTH DAILY  . MELATONIN PO Take by mouth daily.  . Multiple Vitamin (MULTIVITAMIN) tablet Take 1 tablet by mouth daily.  . Multiple Vitamins-Minerals (HAIR SKIN AND NAILS FORMULA PO) Take by mouth.   No facility-administered encounter medications on file as of 07/01/2018.    ALLERGIES: Allergies  Allergen Reactions  . Codeine Shortness Of Breath  . Amoxicillin Rash  . Ampicillin Rash    VACCINATION STATUS: Immunization History  Administered  Date(s) Administered  . Influenza-Unspecified 04/27/2011    HPI Dana Dickerson is 58 y.o. female. -She is returning with new labs for 24-hour urine calcium measurements as well as serum calcium.   She was found to have mild hypercalcemia since May 2018 which did not require any surgical intervention as of yet.   -She denies history of nephrolithiasis, seizure disorder, osteoporosis, dysrhythmias.  He remains on a low-dose of hydrochlorothiazide 12.5 mg p.o. daily for treatment of hypertension. -She denies family history of parathyroid, pituitary, adrenal dysfunction. -She denies any thyroid/parathyroid enlargement, dysphagia, odynophagia, voice change. - She denies any prior history of thyroid dysfunction.  On her repeat labs her calcium was found to be 10.5 , PTH of 15, her 24-hour urine calcium is 221 improving from 263.   -She has no new complaints today.  Review of Systems  Constitutional: + lost  weight,   no fatigue, no subjective hyperthermia, no subjective hypothermia Eyes: no blurry  vision, no xerophthalmia ENT: no sore throat, no nodules palpated in throat, no dysphagia/odynophagia, no hoarseness  Musculoskeletal: no muscle/joint aches Skin: no rashes Neurological: no tremors, no numbness, no tingling, no dizziness Psychiatric: no depression, no anxiety  Objective:    BP 130/85   Pulse 69   Ht 5\' 4"  (1.626 m)   Wt 175 lb (79.4 kg)   BMI 30.04 kg/m   Wt Readings from Last 3 Encounters:  07/01/18 175 lb (79.4 kg)  02/12/18 185 lb (83.9 kg)  01/29/18 186 lb 8 oz (84.6 kg)    Physical Exam  Constitutional: + obese for height, not in acute distress, normal state of mind Eyes: PERRLA, EOMI, no exophthalmos ENT: moist mucous membranes, no thyromegaly, no cervical lymphadenopathy  Musculoskeletal: no gross deformities, strength intact in all four extremities Skin: moist, warm, no rashes Neurological: no tremor with outstretched hands, Deep tendon reflexes normal in all four extremities.  Recent Results (from the past 2160 hour(s))  PTH, intact and calcium     Status: Abnormal   Collection Time: 05/28/18  2:33 PM  Result Value Ref Range   PTH 15 14 - 64 pg/mL    Comment: . Interpretive Guide    Intact PTH           Calcium ------------------    ----------           ------- Normal Parathyroid    Normal               Normal Hypoparathyroidism    Low or Low Normal    Low Hyperparathyroidism    Primary            Normal or High       High    Secondary          High                 Normal or Low    Tertiary           High                 High Non-Parathyroid    Hypercalcemia      Low or Low Normal    High .    Calcium 10.5 (H) 8.6 - 10.4 mg/dL  COMPLETE METABOLIC PANEL WITH GFR     Status: Abnormal   Collection Time: 05/28/18  2:33 PM  Result Value Ref Range   Glucose, Bld 89 65 - 139 mg/dL    Comment: .  Non-fasting reference interval .    BUN 25 7 - 25 mg/dL   Creat 1.19 (H) 0.50 - 1.05 mg/dL    Comment: For patients >7 years of age, the  reference limit for Creatinine is approximately 13% higher for people identified as African-American. .    GFR, Est Non African American 50 (L) > OR = 60 mL/min/1.35m2   GFR, Est African American 58 (L) > OR = 60 mL/min/1.48m2   BUN/Creatinine Ratio 21 6 - 22 (calc)   Sodium 138 135 - 146 mmol/L   Potassium 4.1 3.5 - 5.3 mmol/L   Chloride 102 98 - 110 mmol/L   CO2 25 20 - 32 mmol/L   Calcium 10.5 (H) 8.6 - 10.4 mg/dL   Total Protein 6.5 6.1 - 8.1 g/dL   Albumin 4.4 3.6 - 5.1 g/dL   Globulin 2.1 1.9 - 3.7 g/dL (calc)   AG Ratio 2.1 1.0 - 2.5 (calc)   Total Bilirubin 0.3 0.2 - 1.2 mg/dL   Alkaline phosphatase (APISO) 40 33 - 130 U/L   AST 19 10 - 35 U/L   ALT 24 6 - 29 U/L  PTH-related peptide     Status: Abnormal   Collection Time: 05/28/18  2:33 PM  Result Value Ref Range   PTH-Related Protein (PTH-RP) 13 (L) 14 - 27 pg/mL    Comment: . This is a C-terminal PTH-RP assay. PTH-RP is useful in the differential diagnosis of hypercalcemia and levels may be elevated in patients with tumor-associated hypercalcemia. Elevated results may also be observed in patients with renal disease. . This test was developed and its analytical performance characteristics have been determined by [facility]. It has not been cleared or approved by FDA. This assay has been validated pursuant to the CLIA regulations and is used for clinical purposes.   VITAMIN D 25 Hydroxy (Vit-D Deficiency, Fractures)     Status: None   Collection Time: 05/28/18  2:33 PM  Result Value Ref Range   Vit D, 25-Hydroxy 42 30 - 100 ng/mL    Comment: Vitamin D Status         25-OH Vitamin D: . Deficiency:                    <20 ng/mL Insufficiency:             20 - 29 ng/mL Optimal:                 > or = 30 ng/mL . For 25-OH Vitamin D testing on patients on  D2-supplementation and patients for whom quantitation  of D2 and D3 fractions is required, the  QuestAssureD(TM) 25-OH VIT D, (D2,D3), LC/MS/MS is recommended: order  code [phone] (patients >33yrs). . For more information on this test, go to: http://education.questdiagnostics.com/faq/FAQ163 (This link is being provided for  informational/educational purposes only.)   Calcium, urine, 24 hour     Status: None   Collection Time: 06/10/18 10:41 AM  Result Value Ref Range   Calcium, 24H Urine 221 mg/24 h    Comment:                           Reference Range  35-250                            Low calcium diet 35-200   Creatinine, urine, 24 hour  Status: None   Collection Time: 06/10/18 10:41 AM  Result Value Ref Range   Creatinine, 24H Ur 0.96 0.50 - 2.15 g/24 h     Diabetic Labs (most recent): Lab Results  Component Value Date   HGBA1C 5.8 (H) 10/26/2016   HGBA1C 6.0 (H) 04/21/2015   HGBA1C 6.1 (H) 04/13/2014     Lipid Panel ( most recent) Lipid Panel     Component Value Date/Time   CHOL 203 (H) 12/18/2017 1116   TRIG 127 12/18/2017 1116   HDL 54 12/18/2017 1116   CHOLHDL 3.8 12/18/2017 1116   CHOLHDL 3.1 04/13/2014 1107   VLDL 17 04/13/2014 1107   LDLCALC 124 (H) 12/18/2017 1116      Lab Results  Component Value Date   TSH 0.949 12/18/2017   TSH 0.114 (L) 03/01/2017   TSH 2.210 10/26/2016   TSH 1.100 04/21/2015   TSH 0.191 (L) 04/13/2014   TSH 0.288 (L) 03/31/2013     Assessment & Plan:   1. Hypercalcemia -Her repeat more complete work-up shows calcium high normal at 10.5, associated PTH of 15  considered normal range physiology.  PTH RP is not suggestive of occult malignancy. -Her 24-hour urine calcium measurement shows 2021, improving from 263 during her previous visit.   -She will not require immediate therapeutic intervention at this time, she would be considered for continued observation. -I discussed with her the need for repeat measurements in 6 months including PTH/calcium, and vitamin D panel.  She is vitamin D replete at 25.  -She has  discontinued iodine/kelp supplements given her history of suppressed TSH in September 2018 and given absence of thyroid dysfunction in her history.  Hyperthyroidism/subclinical hyperthyroidism sometimes causes hypercalcemia.  She is also on low-dose hydrochlorothiazide which is known to cause hypercalcemia.  She is advised to continue on this low-dose hydrochlorothiazide for blood pressure treatment at this time.  - I did not initiate any new prescriptions today.  She has prediabetes with A1c of 5.8%, more individualized diet and exercise plan  will be given to her at next visit.  - I advised her  to maintain close follow up with her PCP for primary care needs.   Follow up plan: Return in about 6 months (around 12/30/2018) for Follow up with Pre-visit Labs.   Glade Lloyd, MD [facility] [address] [place], [place] [address] Phone: [phone]  Fax: [phone]     07/01/2018, 3:12 PM  This note was partially dictated with voice recognition software. Similar sounding words can be transcribed inadequately or may not  be corrected upon review.

## 2018-08-27 ENCOUNTER — Other Ambulatory Visit: Payer: Self-pay | Admitting: Adult Health

## 2018-12-23 ENCOUNTER — Telehealth: Payer: Self-pay | Admitting: Adult Health

## 2018-12-23 NOTE — Telephone Encounter (Signed)
Patient called stating that she would like for [facility] to call her in a medication, please contact pt

## 2018-12-23 NOTE — Telephone Encounter (Addendum)
Pt is requesting a refill on Wellbutrin XL 150 mg. Can you refill this? Also needs mail order prescription sent to [facility]. Thanks! [place]

## 2018-12-24 MED ORDER — BUPROPION HCL ER (XL) 150 MG PO TB24: ORAL_TABLET | ORAL | 1 refills | Status: DC

## 2018-12-24 MED ORDER — BUPROPION HCL ER (XL) 150 MG PO TB24: ORAL_TABLET | ORAL | 3 refills | Status: DC

## 2018-12-24 NOTE — Telephone Encounter (Signed)
Refilled Wellbutrin to local and mail order pharmacy

## 2018-12-24 NOTE — Addendum Note (Signed)
Addended by: Derrek Monaco A on: 12/24/2018 10:09 AM   Modules accepted: Orders

## 2018-12-30 ENCOUNTER — Ambulatory Visit: Payer: Managed Care, Other (non HMO) | Admitting: "Endocrinology

## 2019-01-08 ENCOUNTER — Other Ambulatory Visit: Payer: Self-pay | Admitting: Adult Health

## 2019-01-28 ENCOUNTER — Other Ambulatory Visit: Payer: Self-pay

## 2019-01-28 ENCOUNTER — Encounter: Payer: Self-pay | Admitting: Adult Health

## 2019-01-28 ENCOUNTER — Ambulatory Visit (INDEPENDENT_AMBULATORY_CARE_PROVIDER_SITE_OTHER): Payer: Managed Care, Other (non HMO) | Admitting: Adult Health

## 2019-01-28 VITALS — BP 141/91 | HR 72 | Ht 63.75 in | Wt 179.5 lb

## 2019-01-28 DIAGNOSIS — Z131 Encounter for screening for diabetes mellitus: Secondary | ICD-10-CM

## 2019-01-28 DIAGNOSIS — Z1212 Encounter for screening for malignant neoplasm of rectum: Secondary | ICD-10-CM

## 2019-01-28 DIAGNOSIS — N812 Incomplete uterovaginal prolapse: Secondary | ICD-10-CM

## 2019-01-28 DIAGNOSIS — Z1211 Encounter for screening for malignant neoplasm of colon: Secondary | ICD-10-CM | POA: Diagnosis not present

## 2019-01-28 DIAGNOSIS — Z13220 Encounter for screening for lipoid disorders: Secondary | ICD-10-CM

## 2019-01-28 DIAGNOSIS — I10 Essential (primary) hypertension: Secondary | ICD-10-CM | POA: Insufficient documentation

## 2019-01-28 DIAGNOSIS — Z1159 Encounter for screening for other viral diseases: Secondary | ICD-10-CM

## 2019-01-28 DIAGNOSIS — F329 Major depressive disorder, single episode, unspecified: Secondary | ICD-10-CM

## 2019-01-28 DIAGNOSIS — Z1321 Encounter for screening for nutritional disorder: Secondary | ICD-10-CM

## 2019-01-28 DIAGNOSIS — F32A Depression, unspecified: Secondary | ICD-10-CM

## 2019-01-28 DIAGNOSIS — Z01419 Encounter for gynecological examination (general) (routine) without abnormal findings: Secondary | ICD-10-CM | POA: Diagnosis not present

## 2019-01-28 LAB — HEMOCCULT GUIAC POC 1CARD (OFFICE): Fecal Occult Blood, POC: NEGATIVE

## 2019-01-28 MED ORDER — HYDROCHLOROTHIAZIDE 12.5 MG PO CAPS: 12.5000 mg | ORAL_CAPSULE | Freq: Every day | ORAL | 4 refills | Status: DC

## 2019-01-28 MED ORDER — LISINOPRIL 10 MG PO TABS: ORAL_TABLET | ORAL | 4 refills | Status: DC

## 2019-01-28 MED ORDER — BUPROPION HCL ER (XL) 150 MG PO TB24: ORAL_TABLET | ORAL | 4 refills | Status: DC

## 2019-01-28 NOTE — Progress Notes (Signed)
Patient ID: NIKCOLE EISCHEID, female   DOB: 13-Jun-1961, 58 y.o.   MRN: [id number] History of Present Illness: Khaylee is a 58 year old white female, married, PM, in for a well woman gyn exam, she had a normal pap with negative HPV 12/18/17.She is requesting fasting labs.She works at [employer] in [place].     Current Medications, Allergies, Past Medical History, Past Surgical History, Family History and Social History were reviewed in [employer] record.     Review of Systems: Patient denies any headaches, hearing loss, fatigue, blurred vision, shortness of breath, chest pain, abdominal pain, problems with bowel movements, urination(has Urge incontinence), or intercourse(not active). No joint pain or mood swings.Has gained weight since stopped smoking.  Legs hurt at times    Physical Exam:BP (!) 141/91 (BP Location: Left Arm, Patient Position: Sitting, Cuff Size: Normal)   Pulse 72   Ht 5' 3.75" (1.619 m)   Wt 179 lb 8 oz (81.4 kg)   BMI 31.05 kg/m  General:  Well developed, well nourished, no acute distress Skin:  Warm and dry,tan Neck:  Midline trachea, normal thyroid, good ROM, no lymphadenopathy Lungs; Clear to auscultation bilaterally Breast:  No dominant palpable mass, retraction, or nipple discharge Cardiovascular: Regular rate and rhythm Abdomen:  Soft, non tender, no hepatosplenomegaly Pelvic:  External genitalia is normal in appearance, no lesions.  The vagina is normal in appearance, she has a cystocele.Marland Kitchen Urethra has no lesions or masses. The cervix is bulbous. Has + uterine descensus, almost grade 2. Uterus is felt to be normal size, shape, and contour.  No adnexal masses or tenderness noted.Bladder is non tender, no masses felt. Rectal: Good sphincter tone, no polyps, or hemorrhoids felt.  Hemoccult negative. Extremities/musculoskeletal:  No swelling,+ varicosities noted, no clubbing or cyanosis Psych:  No mood changes, alert and cooperative,seems happy Fall  risk is low PHQ 9 score is 6, is on Wellbutrin, denies being suicidal. Examination chaperoned by Levy Pupa LPN.  Impression: 1. Encounter for well woman exam with routine gynecological exam   2. Screening for colorectal cancer   3. Essential hypertension   4. Cystocele with incomplete uterovaginal prolapse   5. Depression, unspecified depression type   6. Screening cholesterol level   7. Screening for diabetes mellitus   8. Encounter for vitamin deficiency screening   9. Encounter for hepatitis C screening test for low risk patient       Plan: Check CBC,CMP,TSH and lipids,A1c, vitamin D and hepatitis C antibody, she declines HIV Physical in 1 year Pap in 2022 Colonoscopy 2021 Mammogram yearly Meds ordered this encounter  Medications  . lisinopril (ZESTRIL) 10 MG tablet    Sig: TAKE 1 TABLET(10 MG) BY MOUTH DAILY    Dispense:  90 tablet    Refill:  4    Order Specific Question:   Supervising Provider    Answer:   Elonda Husky, LUTHER H [[id number]]  . hydrochlorothiazide (MICROZIDE) 12.5 MG capsule    Sig: Take 1 capsule (12.5 mg total) by mouth daily.    Dispense:  90 capsule    Refill:  4    Order Specific Question:   Supervising Provider    Answer:   Elonda Husky, LUTHER H [[id number]]  . buPROPion (WELLBUTRIN XL) 150 MG 24 hr tablet    Sig: TAKE 1 TABLET(150 MG) BY MOUTH DAILY    Dispense:  90 tablet    Refill:  4    **Patient requests 90 days supply**    Order Specific Question:  Supervising Provider    Answer:   Florian Buff [[id number]]  Keep check on BP at home and let me know if stays elevated  Discussed pessary if prolapse gets any worse, try to pee often, don't hold it,

## 2019-01-29 LAB — LIPID PANEL
Chol/HDL Ratio: 4 ratio (ref 0.0–4.4)
Cholesterol, Total: 222 mg/dL — ABNORMAL HIGH (ref 100–199)
HDL: 55 mg/dL (ref >39)
LDL Calculated: 133 mg/dL — ABNORMAL HIGH (ref 0–99)
Triglycerides: 171 mg/dL — ABNORMAL HIGH (ref 0–149)
VLDL Cholesterol Cal: 34 mg/dL (ref 5–40)

## 2019-01-29 LAB — COMPREHENSIVE METABOLIC PANEL
ALT: 29 IU/L (ref 0–32)
AST: 21 IU/L (ref 0–40)
Albumin/Globulin Ratio: 2.3 — ABNORMAL HIGH (ref 1.2–2.2)
Albumin: 4.5 g/dL (ref 3.8–4.9)
Alkaline Phosphatase: 41 IU/L (ref 39–117)
BUN/Creatinine Ratio: 16 (ref 9–23)
BUN: 12 mg/dL (ref 6–24)
Bilirubin Total: 0.3 mg/dL (ref 0.0–1.2)
CO2: 24 mmol/L (ref 20–29)
Calcium: 10.2 mg/dL (ref 8.7–10.2)
Chloride: 100 mmol/L (ref 96–106)
Creatinine, Ser: 0.74 mg/dL (ref 0.57–1.00)
GFR calc Af Amer: 103 mL/min/{1.73_m2} (ref >59)
GFR calc non Af Amer: 90 mL/min/{1.73_m2} (ref >59)
Globulin, Total: 2 g/dL (ref 1.5–4.5)
Glucose: 80 mg/dL (ref 65–99)
Potassium: 4.1 mmol/L (ref 3.5–5.2)
Sodium: 142 mmol/L (ref 134–144)
Total Protein: 6.5 g/dL (ref 6.0–8.5)

## 2019-01-29 LAB — CBC
Hematocrit: 46 % (ref 34.0–46.6)
Hemoglobin: 14.8 g/dL (ref 11.1–15.9)
MCH: 30.6 pg (ref 26.6–33.0)
MCHC: 32.2 g/dL (ref 31.5–35.7)
MCV: 95 fL (ref 79–97)
Platelets: 264 10*3/uL (ref 150–450)
RBC: 4.83 x10E6/uL (ref 3.77–5.28)
RDW: 12.9 % (ref 11.7–15.4)
WBC: 11.5 10*3/uL — ABNORMAL HIGH (ref 3.4–10.8)

## 2019-01-29 LAB — HEMOGLOBIN A1C
Est. average glucose Bld gHb Est-mCnc: 120 mg/dL
Hgb A1c MFr Bld: 5.8 % — ABNORMAL HIGH (ref 4.8–5.6)

## 2019-01-29 LAB — VITAMIN D 25 HYDROXY (VIT D DEFICIENCY, FRACTURES): Vit D, 25-Hydroxy: 45.6 ng/mL (ref 30.0–100.0)

## 2019-01-29 LAB — TSH: TSH: 0.553 u[IU]/mL (ref 0.450–4.500)

## 2019-01-29 LAB — HEPATITIS C ANTIBODY: Hep C Virus Ab: <0.1 s/co ratio (ref 0.0–0.9)

## 2019-01-30 ENCOUNTER — Telehealth: Payer: Self-pay | Admitting: Adult Health

## 2019-01-30 DIAGNOSIS — D72829 Elevated white blood cell count, unspecified: Secondary | ICD-10-CM

## 2019-01-30 NOTE — Telephone Encounter (Signed)
Pt aware of labs, WBC 11.5 will recheck in about 8 weeks, and cholesterol and trig, elevated, decrease fats and carbs

## 2019-02-19 ENCOUNTER — Other Ambulatory Visit: Payer: Self-pay | Admitting: Adult Health

## 2019-03-13 ENCOUNTER — Other Ambulatory Visit (HOSPITAL_COMMUNITY): Payer: Self-pay | Admitting: Family Medicine

## 2019-03-13 DIAGNOSIS — Z1231 Encounter for screening mammogram for malignant neoplasm of breast: Secondary | ICD-10-CM

## 2019-04-01 ENCOUNTER — Other Ambulatory Visit (HOSPITAL_COMMUNITY): Payer: Self-pay | Admitting: Adult Health

## 2019-04-01 DIAGNOSIS — Z1231 Encounter for screening mammogram for malignant neoplasm of breast: Secondary | ICD-10-CM

## 2019-04-09 ENCOUNTER — Ambulatory Visit (HOSPITAL_COMMUNITY): Payer: Managed Care, Other (non HMO)

## 2019-04-16 ENCOUNTER — Ambulatory Visit (HOSPITAL_COMMUNITY)
Admission: RE | Admit: 2019-04-16 | Discharge: 2019-04-16 | Disposition: A | Discharge status: Home / Self Care | Payer: Managed Care, Other (non HMO) | Source: Ambulatory Visit | Attending: Adult Health | Admitting: Adult Health

## 2019-04-16 ENCOUNTER — Telehealth: Payer: Self-pay | Admitting: *Deleted

## 2019-04-16 ENCOUNTER — Other Ambulatory Visit: Payer: Managed Care, Other (non HMO)

## 2019-04-16 ENCOUNTER — Other Ambulatory Visit: Payer: Self-pay

## 2019-04-16 DIAGNOSIS — R7989 Other specified abnormal findings of blood chemistry: Secondary | ICD-10-CM

## 2019-04-16 DIAGNOSIS — Z1231 Encounter for screening mammogram for malignant neoplasm of breast: Secondary | ICD-10-CM | POA: Diagnosis not present

## 2019-04-16 NOTE — Telephone Encounter (Signed)
Left message letting pt know I will have her put on the lab schedule for tomorrow. [place]

## 2019-04-16 NOTE — Telephone Encounter (Signed)
Patient states that Anderson Malta told her to come back in October for recheck of her wbc. Patient requesting orders be put in so she can go tomorrow.

## 2019-04-17 ENCOUNTER — Other Ambulatory Visit: Payer: Managed Care, Other (non HMO)

## 2019-04-17 LAB — CBC
Hematocrit: 42.6 % (ref 34.0–46.6)
Hemoglobin: 14.7 g/dL (ref 11.1–15.9)
MCH: 30.8 pg (ref 26.6–33.0)
MCHC: 34.5 g/dL (ref 31.5–35.7)
MCV: 89 fL (ref 79–97)
Platelets: 304 10*3/uL (ref 150–450)
RBC: 4.77 x10E6/uL (ref 3.77–5.28)
RDW: 12.6 % (ref 11.7–15.4)
WBC: 11.1 10*3/uL — ABNORMAL HIGH (ref 3.4–10.8)

## 2019-05-20 ENCOUNTER — Other Ambulatory Visit: Payer: Self-pay | Admitting: *Deleted

## 2019-05-20 MED ORDER — LISINOPRIL 10 MG PO TABS: ORAL_TABLET | ORAL | 0 refills | Status: DC

## 2019-12-20 ENCOUNTER — Other Ambulatory Visit: Payer: Self-pay | Admitting: Adult Health

## 2020-03-09 ENCOUNTER — Ambulatory Visit: Payer: Managed Care, Other (non HMO) | Admitting: Podiatry

## 2020-03-09 ENCOUNTER — Other Ambulatory Visit: Payer: Self-pay

## 2020-03-09 DIAGNOSIS — L603 Nail dystrophy: Secondary | ICD-10-CM

## 2020-03-09 DIAGNOSIS — S91202A Unspecified open wound of left great toe with damage to nail, initial encounter: Secondary | ICD-10-CM

## 2020-03-09 MED ORDER — GENTAMICIN SULFATE 0.1 % EX CREA: 1.0000 "application " | TOPICAL_CREAM | Freq: Two times a day (BID) | CUTANEOUS | 1 refills | Status: DC

## 2020-03-09 NOTE — Progress Notes (Signed)
   HPI: 59 y.o. female presenting today as a new patient for evaluation of trauma to the left hallux nail plate.  Patient states that approximately 1 week ago she stubbed her toe and part of her nail ripped off.  Is been bleeding with purulent drainage ever since.  She is in a moderate amount of pain today.  She has been applying antibiotic ointment daily.  She presents for further treatment and evaluation  Past Medical History:  Diagnosis Date  . Anxiety   . Hypertension   . Hyperthyroidism   . Serum calcium elevated 12/24/2017   Refer to Dr Dorris Fetch  . Thyroid cyst      Physical Exam: General: The patient is alert and oriented x3 in no acute distress.  Dermatology: Skin is warm, dry and supple bilateral lower extremities. Negative for open lesions or macerations.  Partial loss of the hallux nail plate noted to the left great toe.  There is some serous drainage noted.  Periwound appears healthy and viable with negative erythema or evidence of infection.  Vascular: Palpable pedal pulses bilaterally. No edema or erythema noted. Capillary refill within normal limits.  Neurological: Epicritic and protective threshold grossly intact bilaterally.   Musculoskeletal Exam: Range of motion within normal limits to all pedal and ankle joints bilateral. Muscle strength 5/5 in all groups bilateral.    Assessment: 1.  Traumatic partial loss of the left hallux nail plate   Plan of Care:  1. Patient evaluated.  2.  Today explained to the patient that it would be in her best interest to remove the remaining portion of the nail that is intact.  The patient agrees.  3 mL of 2% lidocaine plain was performed in a digital block fashion of the toe was prepped in aseptic manner. 3.  Total temporary nail avulsion was performed to the left hallux nail plate.  Light dressing applied. 4.  Prescription for gentamicin cream.  Apply daily with a Band-Aid 5.  Return to clinic in 2 weeks  *Works for [employer]       Edrick Kins, DPM [facility]  Dr. Edrick Kins, DPM    [address], [place] [address]                Office [phone]  Fax [phone]

## 2020-03-23 ENCOUNTER — Ambulatory Visit: Payer: Managed Care, Other (non HMO) | Admitting: Podiatry

## 2020-03-23 ENCOUNTER — Other Ambulatory Visit: Payer: Self-pay

## 2020-03-23 DIAGNOSIS — S91202A Unspecified open wound of left great toe with damage to nail, initial encounter: Secondary | ICD-10-CM

## 2020-03-23 DIAGNOSIS — L603 Nail dystrophy: Secondary | ICD-10-CM

## 2020-03-23 MED ORDER — DOXYCYCLINE HYCLATE 100 MG PO TABS: 100.0000 mg | ORAL_TABLET | Freq: Two times a day (BID) | ORAL | 0 refills | Status: DC

## 2020-03-23 NOTE — Progress Notes (Signed)
   HPI: 60 y.o. female presenting today follow-up evaluation of temporary nail avulsion to the left hallux nail plate.  Patient states that she is doing very well.  She has been applying the antibiotic cream but she has not been applying a Band-Aid to the area.  She is concerned today because the toe is slightly red.  She presents for further treatment and evaluation  Past Medical History:  Diagnosis Date  . Anxiety   . Hypertension   . Hyperthyroidism   . Serum calcium elevated 12/24/2017   Refer to Dr Dorris Fetch  . Thyroid cyst      Physical Exam: General: The patient is alert and oriented x3 in no acute distress.  Dermatology: Skin is warm, dry and supple bilateral lower extremities. Negative for open lesions or macerations.  Loss of the left hallux nail plate noted.  Nail bed is red and granular with some very minimal fibrotic purulent debris.  Vascular: Palpable pedal pulses bilaterally.  There is some very mild edema and erythema localized around the toe.  Capillary refill within normal limits.  Neurological: Epicritic and protective threshold grossly intact bilaterally.   Musculoskeletal Exam: Range of motion within normal limits to all pedal and ankle joints bilateral. Muscle strength 5/5 in all groups bilateral.   Assessment: 1.  Status post total temporary nail avulsion left hallux nail plate  Plan of Care:  1. Patient evaluated. 2.  Continue antibiotic cream and a Band-Aid daily 3.  Continue Epsom salt soaks daily 4.  Prescription for doxycycline 100 mg 2 times daily 5.  Return to clinic in 2 weeks      Edrick Kins, DPM [facility]  Dr. Edrick Kins, DPM    [address], [place] [address]                Office [phone]  Fax [phone]

## 2020-03-31 ENCOUNTER — Other Ambulatory Visit (HOSPITAL_COMMUNITY): Payer: Self-pay | Admitting: Adult Health

## 2020-03-31 DIAGNOSIS — Z1231 Encounter for screening mammogram for malignant neoplasm of breast: Secondary | ICD-10-CM

## 2020-04-19 ENCOUNTER — Ambulatory Visit (HOSPITAL_COMMUNITY): Payer: Managed Care, Other (non HMO)

## 2020-04-22 ENCOUNTER — Ambulatory Visit (HOSPITAL_COMMUNITY)
Admission: RE | Admit: 2020-04-22 | Discharge: 2020-04-22 | Disposition: A | Discharge status: Home / Self Care | Payer: Managed Care, Other (non HMO) | Source: Ambulatory Visit | Attending: Adult Health | Admitting: Adult Health

## 2020-04-22 ENCOUNTER — Other Ambulatory Visit: Payer: Self-pay

## 2020-04-22 DIAGNOSIS — Z1231 Encounter for screening mammogram for malignant neoplasm of breast: Secondary | ICD-10-CM | POA: Diagnosis not present

## 2020-05-04 ENCOUNTER — Other Ambulatory Visit: Payer: Managed Care, Other (non HMO) | Admitting: Adult Health

## 2020-05-04 ENCOUNTER — Ambulatory Visit (INDEPENDENT_AMBULATORY_CARE_PROVIDER_SITE_OTHER): Payer: Managed Care, Other (non HMO) | Admitting: Obstetrics & Gynecology

## 2020-05-04 ENCOUNTER — Encounter: Payer: Self-pay | Admitting: Obstetrics & Gynecology

## 2020-05-04 ENCOUNTER — Other Ambulatory Visit (HOSPITAL_COMMUNITY)
Admission: RE | Admit: 2020-05-04 | Discharge: 2020-05-04 | Disposition: A | Discharge status: Home / Self Care | Payer: Managed Care, Other (non HMO) | Source: Ambulatory Visit | Attending: Obstetrics & Gynecology | Admitting: Obstetrics & Gynecology

## 2020-05-04 VITALS — BP 145/91 | HR 75 | Ht 64.2 in | Wt 190.0 lb

## 2020-05-04 DIAGNOSIS — N8110 Cystocele, unspecified: Secondary | ICD-10-CM | POA: Diagnosis not present

## 2020-05-04 DIAGNOSIS — N814 Uterovaginal prolapse, unspecified: Secondary | ICD-10-CM | POA: Diagnosis not present

## 2020-05-04 DIAGNOSIS — Z01419 Encounter for gynecological examination (general) (routine) without abnormal findings: Secondary | ICD-10-CM | POA: Diagnosis present

## 2020-05-04 DIAGNOSIS — Z136 Encounter for screening for cardiovascular disorders: Secondary | ICD-10-CM | POA: Diagnosis not present

## 2020-05-04 NOTE — Progress Notes (Signed)
Subjective:     Dana Dickerson is a 59 y.o. female here for a routine exam.  No LMP recorded. Patient is postmenopausal. G3P3 Birth Control Method:  menopausal Menstrual Calendar(currently): amenorrheic  Current complaints: bladder falling.   Current acute medical issues:     Recent Gynecologic History No LMP recorded. Patient is postmenopausal. Last Pap: 2019,  normal Last mammogram: 2021,    Past Medical History:  Diagnosis Date  . Anxiety   . Hypertension   . Hyperthyroidism   . Serum calcium elevated 12/24/2017   Refer to Dr Dorris Fetch  . Thyroid cyst     Past Surgical History:  Procedure Laterality Date  . COLONOSCOPY WITH PROPOFOL N/A 12/15/2016   Procedure: COLONOSCOPY WITH PROPOFOL;  Surgeon: Jonathon Bellows, MD;  Location: [facility] ENDOSCOPY;  Service: Endoscopy;  Laterality: N/A;  . DILATION AND CURETTAGE OF UTERUS    . tumor removed  09/19/2017    OB History    Gravida  3   Para  3   Term      Preterm      AB      Living        SAB      TAB      Ectopic      Multiple      Live Births              Social History   Socioeconomic History  . Marital status: Married    Spouse name: Not on file  . Number of children: Not on file  . Years of education: Not on file  . Highest education level: Not on file  Occupational History  . Not on file  Tobacco Use  . Smoking status: Former Smoker    Packs/day: 2.00    Years: 40.00    Pack years: 80.00    Types: Cigarettes    Quit date: 01/29/2013    Years since quitting: 7.2  . Smokeless tobacco: Never Used  Vaping Use  . Vaping Use: Never used  Substance and Sexual Activity  . Alcohol use: Yes    Comment: glass of wine occ  . Drug use: No  . Sexual activity: Not Currently    Birth control/protection: Post-menopausal  Other Topics Concern  . Not on file  Social History Narrative  . Not on file   Social Determinants of Health   Financial Resource Strain: Medium Risk  . Difficulty of Paying Living  Expenses: Somewhat hard  Food Insecurity: Food Insecurity Present  . Worried About Charity fundraiser in the Last Year: Sometimes true  . Ran Out of Food in the Last Year: Never true  Transportation Needs: No Transportation Needs  . Lack of Transportation (Medical): No  . Lack of Transportation (Non-Medical): No  Physical Activity: Inactive  . Days of Exercise per Week: 0 days  . Minutes of Exercise per Session: 0 min  Stress: Stress Concern Present  . Feeling of Stress : To some extent  Social Connections: Unknown  . Frequency of Communication with Friends and Family: Once a week  . Frequency of Social Gatherings with Friends and Family: Once a week  . Attends Religious Services: Not on file  . Active Member of Clubs or Organizations: No  . Attends Archivist Meetings: Never  . Marital Status: Married    Family History  Problem Relation Age of Onset  . Heart failure Father   . Aneurysm Maternal Grandfather   . Stroke Other   .  Hypertension Son   . Hypertension Son   . Heart attack Paternal Grandfather   . Mental illness Maternal Grandmother   . Dementia Mother   . Mental illness Sister   . Mental illness Sister      Current Outpatient Medications:  .  Ascorbic Acid (VITAMIN C PO), Take by mouth daily., Disp: , Rfl:  .  buPROPion (WELLBUTRIN XL) 150 MG 24 hr tablet, TAKE 1 TABLET BY MOUTH  DAILY, Disp: 90 tablet, Rfl: 3 .  ELDERBERRY PO, Take by mouth daily., Disp: , Rfl:  .  gentamicin cream (GARAMYCIN) 0.1 %, Apply 1 application topically 2 (two) times daily., Disp: 15 g, Rfl: 1 .  hydrochlorothiazide (MICROZIDE) 12.5 MG capsule, Take 1 capsule (12.5 mg total) by mouth daily., Disp: 90 capsule, Rfl: 4 .  IODINE, KELP, PO, Take by mouth daily., Disp: , Rfl:  .  lisinopril-hydrochlorothiazide (ZESTORETIC) 20-12.5 MG tablet, Take 1 tablet by mouth daily., Disp: , Rfl:  .  MELATONIN PO, Take by mouth daily., Disp: , Rfl:  .  Multiple Vitamin (MULTIVITAMIN)  tablet, Take 1 tablet by mouth daily., Disp: , Rfl:  .  albuterol (VENTOLIN HFA) 108 (90 Base) MCG/ACT inhaler, Inhale into the lungs. (Patient not taking: Reported on 05/04/2020), Disp: , Rfl:  .  benzonatate (TESSALON) 100 MG capsule, Take 100 mg by mouth 3 (three) times daily. (Patient not taking: Reported on 05/04/2020), Disp: , Rfl:  .  buPROPion (WELLBUTRIN SR) 150 MG 12 hr tablet, Take 150 mg by mouth every morning. (Patient not taking: Reported on 05/04/2020), Disp: , Rfl:  .  Clobetasol Propionate (TEMOVATE) 0.05 % external spray, Apply topically 2 (two) times daily as needed. (Patient not taking: Reported on 05/04/2020), Disp: , Rfl:  .  doxycycline (VIBRA-TABS) 100 MG tablet, Take 1 tablet (100 mg total) by mouth 2 (two) times daily. (Patient not taking: Reported on 05/04/2020), Disp: 20 tablet, Rfl: 0 .  ibuprofen (ADVIL,MOTRIN) 200 MG tablet, Take 200 mg by mouth every 6 (six) hours as needed. (Patient not taking: Reported on 05/04/2020), Disp: , Rfl:  .  lisinopril (ZESTRIL) 10 MG tablet, Take 1 daily (Patient not taking: Reported on 05/04/2020), Disp: 90 tablet, Rfl: 0 .  Multiple Vitamins-Minerals (HAIR SKIN AND NAILS FORMULA PO), Take by mouth. (Patient not taking: Reported on 05/04/2020), Disp: , Rfl:  .  VITAMIN A PO, Take by mouth daily. (Patient not taking: Reported on 05/04/2020), Disp: , Rfl:   Review of Systems  Review of Systems  Constitutional: Negative for fever, chills, weight loss, malaise/fatigue and diaphoresis.  HENT: Negative for hearing loss, ear pain, nosebleeds, congestion, sore throat, neck pain, tinnitus and ear discharge.   Eyes: Negative for blurred vision, double vision, photophobia, pain, discharge and redness.  Respiratory: Negative for cough, hemoptysis, sputum production, shortness of breath, wheezing and stridor.   Cardiovascular: Negative for chest pain, palpitations, orthopnea, claudication, leg swelling and PND.  Gastrointestinal: negative for abdominal  pain. Negative for heartburn, nausea, vomiting, diarrhea, constipation, blood in stool and melena.  Genitourinary: Negative for dysuria, urgency, frequency, hematuria and flank pain.  Musculoskeletal: Negative for myalgias, back pain, joint pain and falls.  Skin: Negative for itching and rash.  Neurological: Negative for dizziness, tingling, tremors, sensory change, speech change, focal weakness, seizures, loss of consciousness, weakness and headaches.  Endo/Heme/Allergies: Negative for environmental allergies and polydipsia. Does not bruise/bleed easily.  Psychiatric/Behavioral: Negative for depression, suicidal ideas, hallucinations, memory loss and substance abuse. The patient is not nervous/anxious and does  not have insomnia.        Objective:  Blood pressure (!) 145/91, pulse 75, height 5' 4.2" (1.631 m), weight 190 lb (86.2 kg).   Physical Exam  Vitals reviewed. Constitutional: She is oriented to person, place, and time. She appears well-developed and well-nourished.  HENT:  Head: Normocephalic and atraumatic.        Right Ear: External ear normal.  Left Ear: External ear normal.  Nose: Nose normal.  Mouth/Throat: Oropharynx is clear and moist.  Eyes: Conjunctivae and EOM are normal. Pupils are equal, round, and reactive to light. Right eye exhibits no discharge. Left eye exhibits no discharge. No scleral icterus.  Neck: Normal range of motion. Neck supple. No tracheal deviation present. No thyromegaly present.  Cardiovascular: Normal rate, regular rhythm, normal heart sounds and intact distal pulses.  Exam reveals no gallop and no friction rub.   No murmur heard. Respiratory: Effort normal and breath sounds normal. No respiratory distress. She has no wheezes. She has no rales. She exhibits no tenderness.  GI: Soft. Bowel sounds are normal. She exhibits no distension and no mass. There is no tenderness. There is no rebound and no guarding.  Genitourinary:  Breasts no masses skin  changes or nipple changes bilaterally      Vulva is normal without lesions Vagina is pink moist without discharge Grade 2 cystocoele Cervix normal in appearance and pap is done Uterus is normal size shape and contour Grade 3 prolapse Adnexa is negative with normal sized ovaries  {Rectal    hemoccult negative, normal tone, no masses  Musculoskeletal: Normal range of motion. She exhibits no edema and no tenderness.  Neurological: She is alert and oriented to person, place, and time. She has normal reflexes. She displays normal reflexes. No cranial nerve deficit. She exhibits normal muscle tone. Coordination normal.  Skin: Skin is warm and dry. No rash noted. No erythema. No pallor.  Psychiatric: She has a normal mood and affect. Her behavior is normal. Judgment and thought content normal.       Medications Ordered at today's visit: No orders of the defined types were placed in this encounter.   Other orders placed at today's visit: Orders Placed This Encounter  Procedures  . CBC  . Comp Met (CMET)  . Lipid Profile  . TSH      Assessment:    Normal Gyn exam.   Grade 3 uterine prolapse Grade 2 cystocoele Plan:    Contraception: post menopausal status. Follow up in: prn month. 1 month for pessary fitting     Return in about 1 month (around 06/03/2020) for pessary fitting , with Dr Elonda Husky.

## 2020-05-05 LAB — COMPREHENSIVE METABOLIC PANEL
ALT: 27 IU/L (ref 0–32)
AST: 23 IU/L (ref 0–40)
Albumin/Globulin Ratio: 2 (ref 1.2–2.2)
Albumin: 4.5 g/dL (ref 3.8–4.9)
Alkaline Phosphatase: 55 IU/L (ref 44–121)
BUN/Creatinine Ratio: 17 (ref 12–28)
BUN: 15 mg/dL (ref 8–27)
Bilirubin Total: 0.2 mg/dL (ref 0.0–1.2)
CO2: 24 mmol/L (ref 20–29)
Calcium: 10.2 mg/dL (ref 8.7–10.3)
Chloride: 103 mmol/L (ref 96–106)
Creatinine, Ser: 0.86 mg/dL (ref 0.57–1.00)
GFR calc Af Amer: 85 mL/min/{1.73_m2} (ref >59)
GFR calc non Af Amer: 74 mL/min/{1.73_m2} (ref >59)
Globulin, Total: 2.3 g/dL (ref 1.5–4.5)
Glucose: 96 mg/dL (ref 65–99)
Potassium: 4.8 mmol/L (ref 3.5–5.2)
Sodium: 141 mmol/L (ref 134–144)
Total Protein: 6.8 g/dL (ref 6.0–8.5)

## 2020-05-05 LAB — CBC
Hematocrit: 45 % (ref 34.0–46.6)
Hemoglobin: 15.2 g/dL (ref 11.1–15.9)
MCH: 30.5 pg (ref 26.6–33.0)
MCHC: 33.8 g/dL (ref 31.5–35.7)
MCV: 90 fL (ref 79–97)
Platelets: 364 10*3/uL (ref 150–450)
RBC: 4.98 x10E6/uL (ref 3.77–5.28)
RDW: 12.6 % (ref 11.7–15.4)
WBC: 14.3 10*3/uL — ABNORMAL HIGH (ref 3.4–10.8)

## 2020-05-05 LAB — LIPID PANEL
Chol/HDL Ratio: 3.9 ratio (ref 0.0–4.4)
Cholesterol, Total: 227 mg/dL — ABNORMAL HIGH (ref 100–199)
HDL: 58 mg/dL (ref >39)
LDL Chol Calc (NIH): 144 mg/dL — ABNORMAL HIGH (ref 0–99)
Triglycerides: 143 mg/dL (ref 0–149)
VLDL Cholesterol Cal: 25 mg/dL (ref 5–40)

## 2020-05-05 LAB — TSH: TSH: 0.403 u[IU]/mL — ABNORMAL LOW (ref 0.450–4.500)

## 2020-05-05 LAB — CYTOLOGY - PAP
Comment: NEGATIVE
Diagnosis: NEGATIVE
High risk HPV: NEGATIVE

## 2020-06-08 ENCOUNTER — Other Ambulatory Visit: Payer: Self-pay

## 2020-06-08 ENCOUNTER — Encounter: Payer: Self-pay | Admitting: Obstetrics & Gynecology

## 2020-06-08 ENCOUNTER — Ambulatory Visit: Payer: Managed Care, Other (non HMO) | Admitting: Obstetrics & Gynecology

## 2020-06-08 VITALS — BP 153/81 | HR 68 | Ht 64.0 in | Wt 188.0 lb

## 2020-06-08 DIAGNOSIS — N814 Uterovaginal prolapse, unspecified: Secondary | ICD-10-CM

## 2020-06-08 DIAGNOSIS — N8110 Cystocele, unspecified: Secondary | ICD-10-CM | POA: Diagnosis not present

## 2020-06-08 NOTE — Progress Notes (Signed)
Chief Complaint  Patient presents with   Pessary Fitting    Blood pressure (!) 153/81, pulse 68, height 5\' 4"  (1.626 m), weight 188 lb (85.3 kg).  Dana Dickerson presents today for routine follow up related to her pessary.   She is here for her initial fitting today  She has a Grade 3 cystocoele and apical prolapse, rectum is well upported She reports no vaginal discharge and no vaginal bleeding   Likert scale(1 not bothersome -5 very bothersome)  :  1  Exam reveals no undue vaginal mucosal pressure of breakdown, no discharge and no vaginal bleeding.  Vaginal Epithelial Abnormality Classification System:   2 0    No abnormalities 1    Epithelial erythema 2    Granulation tissue 3    Epithelial break or erosion, 1 cm or less 4    Epithelial break or erosion, 1 cm or greater  The pessary is removed, cleaned and replaced without difficulty.      ICD-10-CM   1. POP-Q stage 3 cystocele, Pessary fit 06/08/20, Milex ring with support #4  N81.10   2. Uterine prolapse, Grade 3  N81.4      Dana Dickerson will be sen back in 1 months for continued follow up.  Florian Buff, MD  06/08/2020 9:29 AM

## 2020-07-06 ENCOUNTER — Other Ambulatory Visit: Payer: Self-pay

## 2020-07-06 ENCOUNTER — Encounter: Payer: Self-pay | Admitting: Obstetrics & Gynecology

## 2020-07-06 ENCOUNTER — Ambulatory Visit (INDEPENDENT_AMBULATORY_CARE_PROVIDER_SITE_OTHER): Payer: Managed Care, Other (non HMO) | Admitting: Obstetrics & Gynecology

## 2020-07-06 VITALS — BP 154/87 | HR 64 | Ht 64.0 in | Wt 190.0 lb

## 2020-07-06 DIAGNOSIS — N8110 Cystocele, unspecified: Secondary | ICD-10-CM | POA: Diagnosis not present

## 2020-07-06 DIAGNOSIS — Z4689 Encounter for fitting and adjustment of other specified devices: Secondary | ICD-10-CM | POA: Diagnosis not present

## 2020-07-06 DIAGNOSIS — N814 Uterovaginal prolapse, unspecified: Secondary | ICD-10-CM | POA: Diagnosis not present

## 2020-07-06 NOTE — Progress Notes (Signed)
Chief Complaint  Patient presents with  . Pessary Check    Blood pressure (!) 154/87, pulse 64, height 5\' 4"  (1.626 m), weight 190 lb (86.2 kg).  Dana Dickerson presents today for routine follow up related to her pessary.   She uses a Milex ring with support  She reports no vaginal discharge and no vaginal bleeding   Likert scale(1 not bothersome -5 very bothersome)  :  1  Exam reveals no undue vaginal mucosal pressure of breakdown, no discharge and no vaginal bleeding.  Vaginal Epithelial Abnormality Classification System:   0 0    No abnormalities 1    Epithelial erythema 2    Granulation tissue 3    Epithelial break or erosion, 1 cm or less 4    Epithelial break or erosion, 1 cm or greater  The pessary is removed, cleaned and replaced without difficulty.      ICD-10-CM   1. Pessary maintenance, Milex ring with support #4, OF 06/08/20  Z46.89   2. POP-Q stage 3 cystocele, Pessary fit 06/08/20, Milex ring with support #4  N81.10   3. Uterine prolapse, Grade 3  N81.4      Dana Dickerson will be sen back in 3 months for continued follow up.  Florian Buff, MD  07/06/2020 9:19 AM

## 2020-10-05 ENCOUNTER — Ambulatory Visit: Payer: Managed Care, Other (non HMO) | Admitting: Obstetrics & Gynecology

## 2020-10-05 ENCOUNTER — Other Ambulatory Visit: Payer: Self-pay

## 2020-10-05 ENCOUNTER — Encounter: Payer: Self-pay | Admitting: Obstetrics & Gynecology

## 2020-10-05 VITALS — BP 169/85 | HR 58 | Ht 64.0 in | Wt 186.2 lb

## 2020-10-05 DIAGNOSIS — N8110 Cystocele, unspecified: Secondary | ICD-10-CM | POA: Diagnosis not present

## 2020-10-05 DIAGNOSIS — R102 Pelvic and perineal pain: Secondary | ICD-10-CM

## 2020-10-05 NOTE — Progress Notes (Signed)
Chief Complaint  Patient presents with  . Pessary Check    Blood pressure (!) 169/85, pulse (!) 58, height 5\' 4"  (1.626 m), weight 186 lb 3.2 oz (84.5 kg).  Dana Dickerson presents today for routine follow up related to her pessary.   She uses a ring with support She reports minimal vaginal discharge and no vaginal bleeding   Likert scale(1 not bothersome -5 very bothersome)  :  1  Exam reveals no undue vaginal mucosal pressure of breakdown, no discharge and no vaginal bleeding.  Her other concern:  Pelvic pain- for the past mo or so she has noted right lower pelvic pain.  Went to urgent care- work up was negative, treated for a UTI.  Pain still present- describes it as achy, 5/10, improved with ibuprofen.  Not all the time, not sure what makes it worse or better.    General no acute distress, female NAD Abd: soft and non-tender, no reproducible pain, no rebound, no guarding, no discrete mass appreciated Vulva:  normal appearing vulva with no masses, tenderness or lesions Vagina:  normal mucosa, no discharge The pessary is removed, cleaned and replaced without difficulty. Vaginal Epithelial Abnormality Classification System:   0 0    No abnormalities 1    Epithelial erythema 2    Granulation tissue 3    Epithelial break or erosion, 1 cm or less 4    Epithelial break or erosion, 1 cm or greater    ICD-10-CM   1. POP-Q stage 3 cystocele, Pessary fit 06/08/20, Milex ring with support #4  N81.10   2. Pelvic pain  R10.2     -continue with pessary cleaning/check q 34mos -plan for TVUS to r/o underlying gynecologic etiology.  Suspect pain may be arthritis- advised to continue with NSAIDs when needed with food Further management pending results of TVUS  Janyth Pupa, DO Attending [place], [place] for [facility], [place]

## 2020-11-08 ENCOUNTER — Other Ambulatory Visit: Payer: Self-pay | Admitting: Obstetrics & Gynecology

## 2020-11-08 DIAGNOSIS — R102 Pelvic and perineal pain: Secondary | ICD-10-CM

## 2020-11-09 ENCOUNTER — Other Ambulatory Visit: Payer: Managed Care, Other (non HMO)

## 2020-11-09 ENCOUNTER — Other Ambulatory Visit: Payer: Self-pay

## 2020-11-09 ENCOUNTER — Ambulatory Visit (INDEPENDENT_AMBULATORY_CARE_PROVIDER_SITE_OTHER): Payer: Managed Care, Other (non HMO)

## 2020-11-09 DIAGNOSIS — R102 Pelvic and perineal pain: Secondary | ICD-10-CM | POA: Diagnosis not present

## 2020-11-09 NOTE — Progress Notes (Signed)
Pelvic US T/TV: homogeneous anteverted uterus,small anterior right intramural fibroid .6 x .6 x .6 cm,EEC 3.1 mm,normal ovaries,ovaries appear mobile,no free fluid,no pain during ultrasound  Chaperone Peggy

## 2020-11-20 ENCOUNTER — Other Ambulatory Visit: Payer: Self-pay | Admitting: Adult Health

## 2020-11-30 ENCOUNTER — Other Ambulatory Visit: Payer: Self-pay

## 2020-11-30 ENCOUNTER — Ambulatory Visit (INDEPENDENT_AMBULATORY_CARE_PROVIDER_SITE_OTHER): Payer: Managed Care, Other (non HMO) | Admitting: Podiatry

## 2020-11-30 DIAGNOSIS — S91202A Unspecified open wound of left great toe with damage to nail, initial encounter: Secondary | ICD-10-CM | POA: Diagnosis not present

## 2020-11-30 MED ORDER — GENTAMICIN SULFATE 0.1 % EX CREA: 1.0000 "application " | TOPICAL_CREAM | Freq: Two times a day (BID) | CUTANEOUS | 1 refills | Status: DC

## 2020-11-30 MED ORDER — DOXYCYCLINE HYCLATE 100 MG PO TABS: 100.0000 mg | ORAL_TABLET | Freq: Two times a day (BID) | ORAL | 1 refills | Status: AC

## 2020-11-30 NOTE — Progress Notes (Signed)
   HPI: 60 y.o. female presenting today for a reinjury to the left great toe.  Patient had a toenail removed from her left great toe on 03/09/2020.  As it was very regrowing she had a pedicure salon applying acrylic nail.  Unfortunately, on 07/29/9530 the acrylic nail caught on a piece of furniture and ripped the toenail back.  She has been wrapping the toenail and she presents today for further treatment evaluation  Past Medical History:  Diagnosis Date  . Anxiety   . Hypertension   . Hyperthyroidism   . Serum calcium elevated 12/24/2017   Refer to Dr Dorris Fetch  . Thyroid cyst      Physical Exam: General: The patient is alert and oriented x3 in no acute distress.  Dermatology: Skin is warm, dry and supple bilateral lower extremities. Negative for open lesions or macerations.  Loosely adhered toenail with acrylic nail over the distal portion of the nail.  It is very sensitive and painful to palpation.  There is some drainage underneath the nail plate.  Vascular: Palpable pedal pulses bilaterally. No edema or erythema noted. Capillary refill within normal limits.  Neurological: Epicritic and protective threshold grossly intact bilaterally.   Musculoskeletal Exam: Range of motion within normal limits to all pedal and ankle joints bilateral. Muscle strength 5/5 in all groups bilateral.    Assessment: 1.  Traumatic partial loss of the left hallux nail plate   Plan of Care:  1. Patient evaluated.  2.  Today explained to the patient that it would be in her best interest to remove the remaining portion of the nail that is intact.  The patient agrees.  3 mL of 2% lidocaine plain was performed in a digital block fashion of the toe was prepped in aseptic manner. 3.  Total temporary nail avulsion was performed to the left hallux nail plate.  Light dressing applied. 4.  Prescription for gentamicin cream.  Apply daily with a Band-Aid 5.  Prescription for doxycycline 100 mg 2 times daily #20  6.  Return  to clinic in 2 weeks  *Works for [employer]      Edrick Kins, DPM [facility]  Dr. Edrick Kins, DPM    [address], [place] [address]                Office [phone]  Fax [phone]

## 2020-12-14 ENCOUNTER — Other Ambulatory Visit: Payer: Self-pay

## 2020-12-14 ENCOUNTER — Encounter: Payer: Self-pay | Admitting: Podiatry

## 2020-12-14 ENCOUNTER — Ambulatory Visit (INDEPENDENT_AMBULATORY_CARE_PROVIDER_SITE_OTHER): Payer: Managed Care, Other (non HMO) | Admitting: Podiatry

## 2020-12-14 DIAGNOSIS — S91202A Unspecified open wound of left great toe with damage to nail, initial encounter: Secondary | ICD-10-CM

## 2020-12-14 DIAGNOSIS — L603 Nail dystrophy: Secondary | ICD-10-CM

## 2020-12-14 NOTE — Progress Notes (Signed)
   HPI: 60 y.o. female presenting today status post total temporary nail avulsion to the left hallux nail plate.  On 11/26/2020 the patient's acrylic nail got caught on a piece of furniture and ripped the toenail back.  She came into the office on 11/30/2020 and total temporary nail avulsion was performed.  The patient took her oral doxycycline as prescribed.  She is also been applying the antibiotic cream.  She states that she feels significantly better.  Past Medical History:  Diagnosis Date   Anxiety    Hypertension    Hyperthyroidism    Serum calcium elevated 12/24/2017   Refer to Dr Dorris Fetch   Thyroid cyst      Physical Exam: General: The patient is alert and oriented x3 in no acute distress.  Dermatology: Skin is warm, dry and supple bilateral lower extremities. Negative for open lesions or macerations.  The underlying nailbed to the left hallux appears stable with good routine healing.  No clinical evidence of infection.  No erythema or edema noted.  Vascular: Palpable pedal pulses bilaterally. No edema or erythema noted. Capillary refill within normal limits.  Neurological: Epicritic and protective threshold grossly intact bilaterally.   Musculoskeletal Exam: No pedal deformities noted  Assessment: 1.  S/P total temporary nail avulsion left hallux 11/30/2020   Plan of Care:  1. Patient evaluated.  2.  Patient may resume full activity no restrictions 3.  Recommend antibiotic gentamicin cream applied to to the area without a Band-Aid daily for the next week or 2 4.  Return to clinic as needed  *Works for [employer], DPM [facility]  Dr. Edrick Kins, DPM    [address], [place] [address]                Office [phone]  Fax [phone]

## 2021-02-01 ENCOUNTER — Encounter: Payer: Self-pay | Admitting: Obstetrics & Gynecology

## 2021-02-01 ENCOUNTER — Other Ambulatory Visit: Payer: Self-pay

## 2021-02-01 ENCOUNTER — Ambulatory Visit: Payer: Managed Care, Other (non HMO) | Admitting: Obstetrics & Gynecology

## 2021-02-01 VITALS — BP 154/84 | HR 63 | Ht 64.0 in | Wt 183.0 lb

## 2021-02-01 DIAGNOSIS — N814 Uterovaginal prolapse, unspecified: Secondary | ICD-10-CM | POA: Diagnosis not present

## 2021-02-01 DIAGNOSIS — N8110 Cystocele, unspecified: Secondary | ICD-10-CM | POA: Diagnosis not present

## 2021-02-01 NOTE — Progress Notes (Signed)
Chief Complaint  Patient presents with   Pessary Check    Blood pressure (!) 154/84, pulse 63, height '5\' 4"'$  (1.626 m), weight 183 lb (83 kg).  Dana Dickerson presents today for routine follow up related to her pessary.   She uses a Milex ring with support #4 She reports no vaginal discharge and no vaginal bleeding   Likert scale(1 not bothersome -5 very bothersome)  :  1  Exam reveals no undue vaginal mucosal pressure of breakdown, no discharge and no vaginal bleeding.  Vaginal Epithelial Abnormality Classification System:   0 0    No abnormalities 1    Epithelial erythema 2    Granulation tissue 3    Epithelial break or erosion, 1 cm or less 4    Epithelial break or erosion, 1 cm or greater  The pessary is removed, cleaned and replaced without difficulty.      ICD-10-CM   1. POP-Q stage 3 cystocele, Pessary fit 06/08/20, Milex ring with support #4  N81.10     2. Uterine prolapse, Grade 3  N81.4        Dana Dickerson will be sen back in 4 months for continued follow up.  Florian Buff, MD  02/01/2021 9:57 AM

## 2021-03-22 ENCOUNTER — Other Ambulatory Visit (HOSPITAL_COMMUNITY): Payer: Self-pay | Admitting: Obstetrics & Gynecology

## 2021-03-22 DIAGNOSIS — Z1231 Encounter for screening mammogram for malignant neoplasm of breast: Secondary | ICD-10-CM

## 2021-03-28 ENCOUNTER — Encounter: Payer: Self-pay | Admitting: Obstetrics & Gynecology

## 2021-03-28 ENCOUNTER — Telehealth: Payer: Self-pay | Admitting: Obstetrics & Gynecology

## 2021-03-28 NOTE — Telephone Encounter (Signed)
Pt is out of town & pessary fell out while she was using the restroom She's very concerned because it's never fell out before.  She would like a call back to discuss her concerns.  She's scheduled to come in 04/04/2021 @ 4:10 to have it re-inserted

## 2021-03-28 NOTE — Telephone Encounter (Signed)
Pt is out of town and pessary fell out while using the bathroom. She got the pessary and put it in a [employer] bag. Pt was advised to bring pessary with her to appt; Dr. Elonda Husky will clean pessary and re insert it. Pt voiced understanding. [place]

## 2021-04-04 ENCOUNTER — Encounter: Payer: Self-pay | Admitting: Obstetrics & Gynecology

## 2021-04-04 ENCOUNTER — Other Ambulatory Visit: Payer: Self-pay

## 2021-04-04 ENCOUNTER — Ambulatory Visit: Payer: Managed Care, Other (non HMO) | Admitting: Obstetrics & Gynecology

## 2021-04-04 DIAGNOSIS — N814 Uterovaginal prolapse, unspecified: Secondary | ICD-10-CM | POA: Diagnosis not present

## 2021-04-04 DIAGNOSIS — N8110 Cystocele, unspecified: Secondary | ICD-10-CM | POA: Diagnosis not present

## 2021-04-04 NOTE — Progress Notes (Signed)
Chief Complaint  Patient presents with   Pessary fell out last week    There were no vitals taken for this visit.  Dana Dickerson presents today for routine follow up related to her pessary.   She uses a Milex ring with support #4, but it fell out while she was in [place] She is in today for evaluation On exam fit for Milex ring with support #5 She reports no vaginal discharge and no vaginal bleeding   Likert scale(1 not bothersome -5 very bothersome)  :  1  Exam reveals no undue vaginal mucosal pressure of breakdown, no discharge and no vaginal bleeding.  Vaginal Epithelial Abnormality Classification System:   0 0    No abnormalities 1    Epithelial erythema 2    Granulation tissue 3    Epithelial break or erosion, 1 cm or less 4    Epithelial break or erosion, 1 cm or greater  The pessary is removed, cleaned and replaced without difficulty.    No diagnosis found.   ERICAH SCOTTO will be sen back in 4 months for continued follow up.  Florian Buff, MD  04/04/2021 4:34 PM

## 2021-04-25 ENCOUNTER — Ambulatory Visit (HOSPITAL_COMMUNITY)
Admission: RE | Admit: 2021-04-25 | Discharge: 2021-04-25 | Disposition: A | Discharge status: Home / Self Care | Payer: Managed Care, Other (non HMO) | Source: Ambulatory Visit | Attending: Obstetrics & Gynecology | Admitting: Obstetrics & Gynecology

## 2021-04-25 ENCOUNTER — Other Ambulatory Visit: Payer: Self-pay

## 2021-04-25 DIAGNOSIS — Z1231 Encounter for screening mammogram for malignant neoplasm of breast: Secondary | ICD-10-CM | POA: Insufficient documentation

## 2021-06-07 ENCOUNTER — Other Ambulatory Visit: Payer: Managed Care, Other (non HMO) | Admitting: Obstetrics & Gynecology

## 2021-06-13 ENCOUNTER — Ambulatory Visit (INDEPENDENT_AMBULATORY_CARE_PROVIDER_SITE_OTHER): Payer: Managed Care, Other (non HMO) | Admitting: Obstetrics & Gynecology

## 2021-06-13 ENCOUNTER — Encounter: Payer: Self-pay | Admitting: Obstetrics & Gynecology

## 2021-06-13 ENCOUNTER — Other Ambulatory Visit: Payer: Self-pay

## 2021-06-13 VITALS — BP 157/92 | HR 65 | Ht 64.0 in | Wt 186.0 lb

## 2021-06-13 DIAGNOSIS — Z136 Encounter for screening for cardiovascular disorders: Secondary | ICD-10-CM

## 2021-06-13 DIAGNOSIS — Z1211 Encounter for screening for malignant neoplasm of colon: Secondary | ICD-10-CM | POA: Diagnosis not present

## 2021-06-13 DIAGNOSIS — Z01419 Encounter for gynecological examination (general) (routine) without abnormal findings: Secondary | ICD-10-CM | POA: Diagnosis not present

## 2021-06-13 DIAGNOSIS — N8110 Cystocele, unspecified: Secondary | ICD-10-CM | POA: Diagnosis not present

## 2021-06-13 DIAGNOSIS — Z1212 Encounter for screening for malignant neoplasm of rectum: Secondary | ICD-10-CM

## 2021-06-13 NOTE — Progress Notes (Signed)
Subjective:     Dana Dickerson is a 60 y.o. female here for a routine exam.  No LMP recorded. Patient is postmenopausal. G3P3 Birth Control Method:  menopausal Menstrual Calendar(currently): amenorrhea  Current complaints: pessary.   Current acute medical issues:  none   Recent Gynecologic History No LMP recorded. Patient is postmenopausal. Last Pap: 11/21,  normal Last mammogram: 10/22,  normal  Past Medical History:  Diagnosis Date   Anxiety    Hypertension    Hyperthyroidism    Serum calcium elevated 12/24/2017   Refer to Dr Dorris Fetch   Thyroid cyst     Past Surgical History:  Procedure Laterality Date   COLONOSCOPY WITH PROPOFOL N/A 12/15/2016   Procedure: COLONOSCOPY WITH PROPOFOL;  Surgeon: Jonathon Bellows, MD;  Location: [facility] ENDOSCOPY;  Service: Endoscopy;  Laterality: N/A;   DILATION AND CURETTAGE OF UTERUS     tumor removed  09/19/2017    OB History     Gravida  3   Para  3   Term      Preterm      AB      Living         SAB      IAB      Ectopic      Multiple      Live Births              Social History   Socioeconomic History   Marital status: Married    Spouse name: Not on file   Number of children: Not on file   Years of education: Not on file   Highest education level: Not on file  Occupational History   Not on file  Tobacco Use   Smoking status: Former    Packs/day: 2.00    Years: 40.00    Pack years: 80.00    Types: Cigarettes    Quit date: 01/29/2013    Years since quitting: 8.3   Smokeless tobacco: Never  Vaping Use   Vaping Use: Never used  Substance and Sexual Activity   Alcohol use: Yes    Comment: glass of wine occ   Drug use: No   Sexual activity: Not Currently    Birth control/protection: Post-menopausal  Other Topics Concern   Not on file  Social History Narrative   Not on file   Social Determinants of Health   Financial Resource Strain: Medium Risk   Difficulty of Paying Living Expenses: Somewhat hard   Food Insecurity: Food Insecurity Present   Worried About [place] in the Last Year: Sometimes true   Ran Out of Food in the Last Year: Sometimes true  Transportation Needs: No Transportation Needs   Lack of Transportation (Medical): No   Lack of Transportation (Non-Medical): No  Physical Activity: Inactive   Days of Exercise per Week: 0 days   Minutes of Exercise per Session: 0 min  Stress: Stress Concern Present   Feeling of Stress : To some extent  Social Connections: Moderately Isolated   Frequency of Communication with Friends and Family: Once a week   Frequency of Social Gatherings with Friends and Family: Once a week   Attends Religious Services: More than 4 times per year   Active Member of [employer] or Organizations: No   Attends Archivist Meetings: Never   Marital Status: Married    Family History  Problem Relation Age of Onset   Heart failure Father    Aneurysm Maternal Grandfather  Stroke Other    Hypertension Son    Hypertension Son    Heart attack Paternal Grandfather    Mental illness Maternal Grandmother    Dementia Mother    Mental illness Sister    Mental illness Sister      Current Outpatient Medications:    Ascorbic Acid (VITAMIN C) 100 MG tablet, Take 100 mg by mouth daily., Disp: , Rfl:    buPROPion (WELLBUTRIN XL) 150 MG 24 hr tablet, TAKE 1 TABLET BY MOUTH  DAILY, Disp: 90 tablet, Rfl: 3   ELDERBERRY PO, Take by mouth daily., Disp: , Rfl:    ibuprofen (ADVIL,MOTRIN) 200 MG tablet, Take 200 mg by mouth every 6 (six) hours as needed., Disp: , Rfl:    IODINE, KELP, PO, Take by mouth daily., Disp: , Rfl:    lisinopril-hydrochlorothiazide (ZESTORETIC) 20-12.5 MG tablet, Take 1 tablet by mouth daily., Disp: , Rfl:    MELATONIN PO, Take by mouth daily., Disp: , Rfl:    Multiple Vitamin (MULTIVITAMIN) tablet, Take 1 tablet by mouth daily., Disp: , Rfl:    zinc gluconate 50 MG tablet, Take 50 mg by mouth daily., Disp: , Rfl:     Clobetasol Propionate (TEMOVATE) 0.05 % external spray, Apply topically. (Patient not taking: Reported on 06/13/2021), Disp: , Rfl:   Review of Systems  Review of Systems  Constitutional: Negative for fever, chills, weight loss, malaise/fatigue and diaphoresis.  HENT: Negative for hearing loss, ear pain, nosebleeds, congestion, sore throat, neck pain, tinnitus and ear discharge.   Eyes: Negative for blurred vision, double vision, photophobia, pain, discharge and redness.  Respiratory: Negative for cough, hemoptysis, sputum production, shortness of breath, wheezing and stridor.   Cardiovascular: Negative for chest pain, palpitations, orthopnea, claudication, leg swelling and PND.  Gastrointestinal: negative for abdominal pain. Negative for heartburn, nausea, vomiting, diarrhea, constipation, blood in stool and melena.  Genitourinary: Negative for dysuria, urgency, frequency, hematuria and flank pain.  Musculoskeletal: Negative for myalgias, back pain, joint pain and falls.  Skin: Negative for itching and rash.  Neurological: Negative for dizziness, tingling, tremors, sensory change, speech change, focal weakness, seizures, loss of consciousness, weakness and headaches.  Endo/Heme/Allergies: Negative for environmental allergies and polydipsia. Does not bruise/bleed easily.  Psychiatric/Behavioral: Negative for depression, suicidal ideas, hallucinations, memory loss and substance abuse. The patient is not nervous/anxious and does not have insomnia.        Objective:  Blood pressure (!) 157/92, pulse 65, height 5\' 4"  (1.626 m), weight 186 lb (84.4 kg).   Physical Exam  Vitals reviewed. Constitutional: She is oriented to person, place, and time. She appears well-developed and well-nourished.  HENT:  Head: Normocephalic and atraumatic.        Right Ear: External ear normal.  Left Ear: External ear normal.  Nose: Nose normal.  Mouth/Throat: Oropharynx is clear and moist.  Eyes: Conjunctivae  and EOM are normal. Pupils are equal, round, and reactive to light. Right eye exhibits no discharge. Left eye exhibits no discharge. No scleral icterus.  Neck: Normal range of motion. Neck supple. No tracheal deviation present. No thyromegaly present.  Cardiovascular: Normal rate, regular rhythm, normal heart sounds and intact distal pulses.  Exam reveals no gallop and no friction rub.   No murmur heard. Respiratory: Effort normal and breath sounds normal. No respiratory distress. She has no wheezes. She has no rales. She exhibits no tenderness.  GI: Soft. Bowel sounds are normal. She exhibits no distension and no mass. There is no tenderness. There is  no rebound and no guarding.  Genitourinary:  Breasts no masses skin changes or nipple changes bilaterally      Vulva is normal without lesions Vagina is pink moist without discharge Cervix normal in appearance and pap is not done Uterus is normal size shape and contour Adnexa is negative with normal sized ovaries  {Rectal    hemoccult negative, normal tone, no masses  Musculoskeletal: Normal range of motion. She exhibits no edema and no tenderness.  Neurological: She is alert and oriented to person, place, and time. She has normal reflexes. She displays normal reflexes. No cranial nerve deficit. She exhibits normal muscle tone. Coordination normal.  Skin: Skin is warm and dry. No rash noted. No erythema. No pallor.  Psychiatric: She has a normal mood and affect. Her behavior is normal. Judgment and thought content normal.       Medications Ordered at today's visit: No orders of the defined types were placed in this encounter.   Other orders placed at today's visit: No orders of the defined types were placed in this encounter.     Assessment:    Normal Gyn exam.   Pessat removed cleaned as well Plan:    Follow up in: 4 months.pessary maintainenece     Return in about 4 months (around 10/12/2021) for Follow up, with Dr Elonda Husky,  pessary.

## 2021-06-13 NOTE — Addendum Note (Signed)
Addended by: Florian Buff on: 06/13/2021 12:17 PM   Modules accepted: Orders

## 2021-06-14 LAB — TSH: TSH: 0.512 u[IU]/mL (ref 0.450–4.500)

## 2021-06-14 LAB — COMPREHENSIVE METABOLIC PANEL
ALT: 25 IU/L (ref 0–32)
AST: 22 IU/L (ref 0–40)
Albumin/Globulin Ratio: 2.1 (ref 1.2–2.2)
Albumin: 4.6 g/dL (ref 3.8–4.8)
Alkaline Phosphatase: 51 IU/L (ref 44–121)
BUN/Creatinine Ratio: 20 (ref 12–28)
BUN: 14 mg/dL (ref 8–27)
Bilirubin Total: 0.3 mg/dL (ref 0.0–1.2)
CO2: 23 mmol/L (ref 20–29)
Calcium: 9.9 mg/dL (ref 8.7–10.3)
Chloride: 101 mmol/L (ref 96–106)
Creatinine, Ser: 0.7 mg/dL (ref 0.57–1.00)
Globulin, Total: 2.2 g/dL (ref 1.5–4.5)
Glucose: 83 mg/dL (ref 70–99)
Potassium: 4.2 mmol/L (ref 3.5–5.2)
Sodium: 140 mmol/L (ref 134–144)
Total Protein: 6.8 g/dL (ref 6.0–8.5)
eGFR: 98 mL/min/{1.73_m2} (ref >59)

## 2021-06-14 LAB — LIPID PANEL
Chol/HDL Ratio: 3.5 ratio (ref 0.0–4.4)
Cholesterol, Total: 225 mg/dL — ABNORMAL HIGH (ref 100–199)
HDL: 64 mg/dL (ref >39)
LDL Chol Calc (NIH): 141 mg/dL — ABNORMAL HIGH (ref 0–99)
Triglycerides: 112 mg/dL (ref 0–149)
VLDL Cholesterol Cal: 20 mg/dL (ref 5–40)

## 2021-06-14 LAB — CBC
Hematocrit: 43.8 % (ref 34.0–46.6)
Hemoglobin: 14.9 g/dL (ref 11.1–15.9)
MCH: 31.2 pg (ref 26.6–33.0)
MCHC: 34 g/dL (ref 31.5–35.7)
MCV: 92 fL (ref 79–97)
Platelets: 291 10*3/uL (ref 150–450)
RBC: 4.77 x10E6/uL (ref 3.77–5.28)
RDW: 13.3 % (ref 11.7–15.4)
WBC: 11.1 10*3/uL — ABNORMAL HIGH (ref 3.4–10.8)

## 2021-08-08 ENCOUNTER — Ambulatory Visit: Payer: Managed Care, Other (non HMO) | Admitting: Obstetrics & Gynecology

## 2021-10-10 ENCOUNTER — Encounter: Payer: Self-pay | Admitting: Obstetrics & Gynecology

## 2021-10-10 ENCOUNTER — Ambulatory Visit (INDEPENDENT_AMBULATORY_CARE_PROVIDER_SITE_OTHER): Payer: Managed Care, Other (non HMO) | Admitting: Obstetrics & Gynecology

## 2021-10-10 VITALS — BP 149/86 | HR 77 | Ht 65.0 in | Wt 183.0 lb

## 2021-10-10 DIAGNOSIS — Z4689 Encounter for fitting and adjustment of other specified devices: Secondary | ICD-10-CM

## 2021-10-10 DIAGNOSIS — N8110 Cystocele, unspecified: Secondary | ICD-10-CM | POA: Diagnosis not present

## 2021-10-10 DIAGNOSIS — I8391 Asymptomatic varicose veins of right lower extremity: Secondary | ICD-10-CM

## 2021-10-10 DIAGNOSIS — N814 Uterovaginal prolapse, unspecified: Secondary | ICD-10-CM

## 2021-10-10 NOTE — Progress Notes (Signed)
? ? ? ?  GYN VISIT ?Patient name: Dana Dickerson MRN [id number]  Date of birth: 12/30/1960 ?Chief Complaint:   ? ?Chief Complaint  ?Patient presents with  ? Pessary Check  ? ?History of Present Illness:   ?LESLEIGH HUGHSON is a 61 y.o. G3P3 PM female being seen today for the following concerns: ? ?-Pessary maintenance: While the pessary is working, she doesn't like it.  She would prefer to proceed with surgery.   Notes occasional discharge with odor.  She reports Dr. Elonda Husky had told her about a pill, but she can't remember what it was. ? ?-Varicose veins: she notes pain and discomfort in her right leg.  She stands for work and note pain is impacting work and her ability to sleep. ? ?Blood pressure (!) 149/86, pulse 77, height '5\' 5"'$  (1.651 m), weight 183 lb (83 kg). ? ?SHERE EISENHART presents today for routine follow up related to her pessary.   ?She uses a Milex ring #5 ?She reports little vaginal discharge and no vaginal bleeding  ? ?Likert scale(1 not bothersome -5 very bothersome)  :  2 ? ?Review of Systems:   ?Pertinent items are noted in HPI ?Denies fever/chills, dizziness, headaches, visual disturbances, fatigue, shortness of breath, chest pain, abdominal pain.  Not sexually active. ?Pertinent History Reviewed:  ?Reviewed past medical,surgical, social, obstetrical and family history.  ?Reviewed problem list, medications and allergies. ?Physical Assessment:  ? ?Vitals:  ? 10/10/21 1028  ?BP: (!) 149/86  ?Pulse: 77  ?Weight: 183 lb (83 kg)  ?Height: '5\' 5"'$  (1.651 m)  ?Body mass index is 30.45 kg/m?. ? ?     Physical Examination:  ? General appearance: alert, well appearing, and in no distress ? Psych: mood appropriate, normal affect ? Skin: warm & dry  ? Cardiovascular: normal heart rate noted ? Respiratory: normal respiratory effort, no distress ? Abdomen: soft, non-tender  ? GU: Pessary removed.  Exam reveals no undue vaginal mucosal pressure of breakdown, little discharge and no vaginal bleeding.   ? ?Vaginal  Epithelial Abnormality Classification System:   0 ?0    No abnormalities ?1    Epithelial erythema ?2    Granulation tissue ?3    Epithelial break or erosion, 1 cm or less ?4    Epithelial break or erosion, 1 cm or greater ? ?Irrigation completed and pessary replaced without difficulty  ? Extremities: no edema, right varicose veins noted ? ?Chaperone: Glenard Haring [place]   ? ?Assessment & Plan:  ? ?  ICD-10-CM   ?1. Uterine prolapse, Grade 3  N81.4   ?  ?2. 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  ?3. cystocele  N81.10   ?  ?4. Pessary maintenance, Milex ring with support #4, OF 06/08/20  Z46.89   ?  ?  ? ?-referral to vascular specialist ?-discussed 2nd opinion with Dr. Wannetta Sender regarding management of POPQ ?-for now continue with pessary, f/u in 65mo ? ?Return in about 4 months (around 02/09/2022) for pessary maintenance (Eure or Varsha Knock). ? ? ?JJanyth Pupa DO ?[facility]ending [facility] ?[facility]? ? ?  ?

## 2021-11-03 ENCOUNTER — Other Ambulatory Visit: Payer: Self-pay | Admitting: Adult Health

## 2021-12-02 ENCOUNTER — Encounter: Payer: Self-pay | Admitting: Obstetrics & Gynecology

## 2021-12-05 ENCOUNTER — Other Ambulatory Visit: Payer: Self-pay | Admitting: *Deleted

## 2021-12-05 DIAGNOSIS — I8393 Asymptomatic varicose veins of bilateral lower extremities: Secondary | ICD-10-CM

## 2021-12-13 ENCOUNTER — Ambulatory Visit: Payer: Managed Care, Other (non HMO) | Admitting: Vascular Surgery

## 2021-12-13 ENCOUNTER — Ambulatory Visit (HOSPITAL_COMMUNITY)
Admission: RE | Admit: 2021-12-13 | Discharge: 2021-12-13 | Disposition: A | Discharge status: Home / Self Care | Payer: Managed Care, Other (non HMO) | Source: Ambulatory Visit | Attending: Vascular Surgery | Admitting: Vascular Surgery

## 2021-12-13 ENCOUNTER — Encounter: Payer: Self-pay | Admitting: Vascular Surgery

## 2021-12-13 DIAGNOSIS — I8393 Asymptomatic varicose veins of bilateral lower extremities: Secondary | ICD-10-CM | POA: Diagnosis not present

## 2021-12-13 DIAGNOSIS — I8390 Asymptomatic varicose veins of unspecified lower extremity: Secondary | ICD-10-CM | POA: Insufficient documentation
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
## 2022-02-06 ENCOUNTER — Ambulatory Visit: Payer: Managed Care, Other (non HMO) | Admitting: Obstetrics & Gynecology

## 2022-02-07 ENCOUNTER — Ambulatory Visit (INDEPENDENT_AMBULATORY_CARE_PROVIDER_SITE_OTHER): Payer: Managed Care, Other (non HMO) | Admitting: Obstetrics & Gynecology

## 2022-02-07 ENCOUNTER — Encounter: Payer: Self-pay | Admitting: Obstetrics & Gynecology

## 2022-02-07 VITALS — BP 146/84 | HR 82 | Ht 64.0 in | Wt 182.0 lb

## 2022-02-07 DIAGNOSIS — Z4689 Encounter for fitting and adjustment of other specified devices: Secondary | ICD-10-CM | POA: Diagnosis not present

## 2022-02-07 DIAGNOSIS — N814 Uterovaginal prolapse, unspecified: Secondary | ICD-10-CM

## 2022-02-07 NOTE — Progress Notes (Signed)
Chief Complaint  Patient presents with   Pessary Check    Blood pressure (!) 146/84, pulse 82, height '5\' 4"'$  (1.626 m), weight 182 lb (82.6 kg).  Dana Dickerson presents today for routine follow up related to her pessary.   She uses a Milex ring with support #4 She reports no vaginal discharge and no vaginal bleeding   Likert scale(1 not bothersome -5 very bothersome)  :  1  Exam reveals no undue vaginal mucosal pressure of breakdown, no discharge and no vaginal bleeding.  Vaginal Epithelial Abnormality Classification System:   0 0    No abnormalities 1    Epithelial erythema 2    Granulation tissue 3    Epithelial break or erosion, 1 cm or less 4    Epithelial break or erosion, 1 cm or greater  The pessary is removed, cleaned and replaced without difficulty.      ICD-10-CM   1. Pessary maintenance, Milex ring with support #4, OF 06/08/20  Z46.89     2. Uterine prolapse, Grade 3  N81.4        Dana Dickerson will be sen back in 4 months for continued follow up.  Florian Buff, MD  02/07/2022 9:11 AM

## 2022-02-21 ENCOUNTER — Encounter: Payer: Self-pay | Admitting: Obstetrics and Gynecology

## 2022-02-21 ENCOUNTER — Ambulatory Visit: Payer: Managed Care, Other (non HMO) | Admitting: Obstetrics and Gynecology

## 2022-02-21 VITALS — BP 146/94 | HR 82 | Ht 64.0 in | Wt 179.0 lb

## 2022-02-21 DIAGNOSIS — N812 Incomplete uterovaginal prolapse: Secondary | ICD-10-CM

## 2022-02-21 DIAGNOSIS — N8110 Cystocele, unspecified: Secondary | ICD-10-CM | POA: Diagnosis not present

## 2022-02-21 DIAGNOSIS — N3281 Overactive bladder: Secondary | ICD-10-CM

## 2022-02-21 DIAGNOSIS — R350 Frequency of micturition: Secondary | ICD-10-CM

## 2022-02-21 LAB — POCT URINALYSIS DIPSTICK
Bilirubin, UA: NEGATIVE
Blood, UA: NEGATIVE
Glucose, UA: NEGATIVE
Ketones, UA: NEGATIVE
Nitrite, UA: NEGATIVE
Protein, UA: NEGATIVE
Spec Grav, UA: <=1.005 — AB (ref 1.010–1.025)
Urobilinogen, UA: 0.2 E.U./dL
pH, UA: 5.5 (ref 5.0–8.0)

## 2022-02-21 NOTE — Patient Instructions (Addendum)
Today we talked about ways to manage bladder urgency such as altering your diet to avoid irritative beverages and foods (bladder diet) as well as attempting to decrease stress and other exacerbating factors.    The Most Bothersome Foods* The Least Bothersome Foods*  Coffee - Regular & Decaf Tea - caffeinated Carbonated beverages - cola, non-colas, diet & caffeine-free Alcohols - Beer, Red Wine, White Wine, Champagne Fruits - Grapefruit, [place], Orange, [employer] - Cranberry, Grapefruit, Orange, Pineapple Vegetables - Tomato & Tomato Products Flavor Enhancers - Hot peppers, Spicy foods, Chili, Horseradish, Vinegar, Monosodium glutamate (MSG) Artificial Sweeteners - NutraSweet, Sweet 'N Low, Equal (sweetener), Saccharin Ethnic foods - [place], [place], [place] food Express Scripts - low-fat & whole Fruits - Bananas, Blueberries, Honeydew melon, Pears, Raisins, Watermelon Vegetables - Broccoli, Brussels Sprouts, [place], Carrots, Cauliflower, [place], Cucumber, Mushrooms, Peas, Radishes, Squash, Zucchini, White potatoes, Sweet potatoes & yams Poultry - Chicken, Eggs, [place], [employer] - Beef, Programmer, multimedia, Lamb Seafood - Shrimp, [place] fish, Salmon Grains - Oat, Rice Snacks - Pretzels, Popcorn  *Lissa Morales et al. Diet and its role in interstitial cystitis/bladder pain syndrome (IC/BPS) and comorbid conditions. [place] 2012 Jan 11.   You have a stage 3 (out of 4) prolapse.  We discussed the fact that it is not life threatening but there are several treatment options. For treatment of pelvic organ prolapse, we discussed options for management including expectant management, conservative management, and surgical management, such as Kegels, a pessary, pelvic floor physical therapy, and specific surgical procedures.

## 2022-02-21 NOTE — Progress Notes (Signed)
[facility] New Patient Evaluation and Consultation  Referring Provider: Janyth Pupa, DO PCP: Kirk Ruths, MD Date of Service: 02/21/2022  SUBJECTIVE Chief Complaint: New Patient (Initial Visit) Dana Dickerson is a 61 y.o. female here for a consult.)  History of Present Illness: Dana Dickerson is a 62 y.o. White or Caucasian female seen in consultation at the request of Dr. Nelda Marseille for evaluation of prolapse.    Review of records significant for: Currently has a #4 ring with support pessary for prolapse.  Urinary Symptoms: Leaks urine with with a full bladder and with urgency. Denies leakage with cough/ sneeze with pessary in place.  Leaks 1 time(s) per day.  Pad use: 1 pads per day.   She is bothered by her UI symptoms.  Day time voids 6.  Nocturia: 1 times per night to void. Voiding dysfunction: she does not empty her bladder well.  does not use a catheter to empty bladder.  When urinating, she feels she has no difficulties Drinks: 1 cup coffee in AM, 4- 16oz bottles water, can diet dr pepper for lunch, 32 oz pepsi zero per day  UTIs: 1 UTI's in the last year.   Denies history of blood in urine and kidney or bladder stones  Pelvic Organ Prolapse Symptoms:                  She Admits to a feeling of a bulge the vaginal area. She feels the bladder is low even with the pessary in place. She can see it coming out of the vagina.  Currently has a ring pessary in place.   Bowel Symptom: Bowel movements: 2 time(s) per day Stool consistency: soft  Straining: yes.  Splinting: no.  Incomplete evacuation: no.  She Denies accidental bowel leakage / fecal incontinence Bowel regimen: none   Sexual Function Sexually active: no.  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  Any history of abnormal pap smears: no.   Medications: She has a current medication list which includes the following prescription(s): vitamin c, bupropion,  clobetasol propionate, elderberry, ibuprofen, lisinopril-hydrochlorothiazide, melatonin, multivitamin, and zinc gluconate.   Allergies: Patient is allergic to codeine, amoxicillin, ampicillin, and penicillins.   Social History:  Social History   Tobacco Use   Smoking status: Former    Packs/day: 2.00    Years: 40.00    Total pack years: 80.00    Types: Cigarettes    Quit date: 01/29/2013    Years since quitting: 9.0   Smokeless tobacco: Never  Vaping Use   Vaping Use: Never used  Substance Use Topics   Alcohol use: Yes    Comment: glass of wine occ   Drug use: No    Relationship status: married She lives with her husband.   She is employed as a Corporate investment banker. Regular exercise: No History of abuse: No  Family History:   Family History  Problem Relation Age of Onset   Heart failure Father    Aneurysm Maternal Grandfather    Stroke Other    Hypertension Son    Hypertension Son     Heart attack Paternal Grandfather    Mental illness Maternal Grandmother    Dementia Mother    Mental illness Sister    Mental illness Sister      Review of Systems: Review of Systems  Constitutional:  Positive for malaise/fatigue. Negative for fever and weight loss.  Respiratory:  Negative for cough, shortness of breath and wheezing.   Cardiovascular:  Negative for chest pain, palpitations and leg swelling.  Gastrointestinal:  Negative for abdominal pain and blood in stool.  Genitourinary:  Negative for dysuria.  Musculoskeletal:  Negative for myalgias.  Skin:  Negative for rash.  Neurological:  Negative for dizziness and headaches.  Endo/Heme/Allergies:  Does not bruise/bleed easily.       + hot flashes  Psychiatric/Behavioral:  Negative for depression. The patient is nervous/anxious.      OBJECTIVE Physical Exam: Vitals:   02/21/22 1031  BP: (!) 146/94  Pulse: 82  Weight: 179 lb (81.2 kg)  Height: '5\' 4"'$  (1.626 m)    Physical Exam Constitutional:      General: She is not in acute distress. Pulmonary:     Effort: Pulmonary effort is normal.  Abdominal:     General: There is no distension.     Palpations: Abdomen is soft.     Tenderness: There is no abdominal tenderness. There is no rebound.  Musculoskeletal:        General: No swelling. Normal range of motion.  Skin:    General: Skin is warm and dry.     Findings: No rash.  Neurological:     Mental Status: She is alert and oriented to person, place, and time.  Psychiatric:        Mood and Affect: Mood normal.        Behavior: Behavior normal.      GU / Detailed Urogynecologic Evaluation:  Pelvic Exam: Normal external female genitalia; Bartholin's and Skene's glands normal in appearance; urethral meatus normal in appearance, no urethral masses or discharge.   CST: negative Pessary removed and cleaned. It was replaced after the exam.  Speculum exam reveals normal vaginal mucosa with atrophy. Cervix normal  appearance. Uterus normal single, nontender. Adnexa no mass, fullness, tenderness.     Pelvic floor strength II/V  Pelvic floor musculature: Right levator non-tender, Right obturator non-tender, Left levator non-tender, Left obturator non-tender  POP-Q:   POP-Q  2  Aa   2                                           Ba  -5                                              C   4                                            Gh  2.5                                            Pb  8                                            tvl   -3                                            Ap  -3                                            Bp  -6                                              D     Rectal Exam:  Normal external rectum   Post-Void Residual (PVR) by Bladder Scan: In order to evaluate bladder emptying, we discussed obtaining a postvoid residual and she agreed to this procedure.  Procedure: The ultrasound unit was placed on the patient's abdomen in the suprapubic region after the patient had voided. A PVR of 69 ml was obtained by bladder scan.  Laboratory Results: POC urine: small leukocytes, negative nitrites Pt asymptomatic for infection.   ASSESSMENT AND PLAN Dana Dickerson is a 61 y.o. with:  1. Prolapse of anterior vaginal wall   2. Uterovaginal prolapse, incomplete   3. Urinary frequency   4. Overactive bladder     Stage III anterior, Stage I posterior, Stage I apical prolapse - For treatment of pelvic organ prolapse, we discussed options for management including expectant management, conservative management, and surgical management, such as Kegels, a pessary, pelvic floor physical therapy, and specific surgical procedures. - We discussed two options for prolapse repair:  1) vaginal repair without mesh - Pros - safer, no mesh complications - Cons - not as strong as mesh repair, higher risk of recurrence  2) laparoscopic repair with  mesh - Pros - stronger, better long-term success - Cons - risks of mesh implant (erosion into vagina or bladder, adhering to the rectum,  pain) - these risks are lower than with a vaginal mesh but still exist - She is intertested in surgery but unsure how she wants to proceed. She will consider her options and let me know what she decides.  - Low risk for occult incontinence as she does not leak with stress with pessary in place. Does not need urodynamic testing.   OAB - We discussed the symptoms of overactive bladder (OAB), which include urinary urgency, urinary frequency, nocturia, with or without urge incontinence.  While we do not know the exact etiology of OAB, several treatment options exist. We discussed management including behavioral therapy (decreasing bladder irritants, urge suppression strategies, timed voids, bladder retraining), physical therapy, medication.  - Advised to significantly decrease bladder irritants and drink more water. List provided on what to avoid.     Dana Folds, MD

## 2022-04-12 ENCOUNTER — Other Ambulatory Visit (HOSPITAL_COMMUNITY): Payer: Self-pay | Admitting: Obstetrics & Gynecology

## 2022-04-12 DIAGNOSIS — Z1231 Encounter for screening mammogram for malignant neoplasm of breast: Secondary | ICD-10-CM

## 2022-04-17 ENCOUNTER — Encounter: Payer: Self-pay | Admitting: *Deleted

## 2022-04-21 ENCOUNTER — Emergency Department: Payer: Managed Care, Other (non HMO)

## 2022-04-21 ENCOUNTER — Other Ambulatory Visit: Payer: Self-pay

## 2022-04-21 ENCOUNTER — Emergency Department
Admission: EM | Admit: 2022-04-21 | Discharge: 2022-04-21 | Disposition: A | Discharge status: Home / Self Care | Payer: Managed Care, Other (non HMO) | Attending: Student in an Organized Health Care Education/Training Program | Admitting: Student in an Organized Health Care Education/Training Program

## 2022-04-21 DIAGNOSIS — I10 Essential (primary) hypertension: Secondary | ICD-10-CM | POA: Insufficient documentation

## 2022-04-21 DIAGNOSIS — R911 Solitary pulmonary nodule: Secondary | ICD-10-CM | POA: Insufficient documentation

## 2022-04-21 DIAGNOSIS — R109 Unspecified abdominal pain: Secondary | ICD-10-CM | POA: Insufficient documentation

## 2022-04-21 DIAGNOSIS — E041 Nontoxic single thyroid nodule: Secondary | ICD-10-CM | POA: Diagnosis not present

## 2022-04-21 DIAGNOSIS — R0781 Pleurodynia: Secondary | ICD-10-CM | POA: Insufficient documentation

## 2022-04-21 LAB — CBC WITH DIFFERENTIAL/PLATELET
Abs Immature Granulocytes: 0.03 10*3/uL (ref 0.00–0.07)
Basophils Absolute: 0.1 10*3/uL (ref 0.0–0.1)
Basophils Relative: 1 %
Eosinophils Absolute: 0.2 10*3/uL (ref 0.0–0.5)
Eosinophils Relative: 2 %
HCT: 41.9 % (ref 36.0–46.0)
Hemoglobin: 14 g/dL (ref 12.0–15.0)
Immature Granulocytes: 0 %
Lymphocytes Relative: 34 %
Lymphs Abs: 3.3 10*3/uL (ref 0.7–4.0)
MCH: 30.3 pg (ref 26.0–34.0)
MCHC: 33.4 g/dL (ref 30.0–36.0)
MCV: 90.7 fL (ref 80.0–100.0)
Monocytes Absolute: 1.1 10*3/uL — ABNORMAL HIGH (ref 0.1–1.0)
Monocytes Relative: 11 %
Neutro Abs: 5 10*3/uL (ref 1.7–7.7)
Neutrophils Relative %: 52 %
Platelets: 285 10*3/uL (ref 150–400)
RBC: 4.62 MIL/uL (ref 3.87–5.11)
RDW: 12.8 % (ref 11.5–15.5)
WBC: 9.7 10*3/uL (ref 4.0–10.5)
nRBC: 0 % (ref 0.0–0.2)

## 2022-04-21 LAB — URINALYSIS, ROUTINE W REFLEX MICROSCOPIC
Bilirubin Urine: NEGATIVE
Glucose, UA: NEGATIVE mg/dL
Hgb urine dipstick: NEGATIVE
Ketones, ur: NEGATIVE mg/dL
Nitrite: NEGATIVE
Protein, ur: NEGATIVE mg/dL
Specific Gravity, Urine: 1.01 (ref 1.005–1.030)
pH: 5 (ref 5.0–8.0)

## 2022-04-21 LAB — COMPREHENSIVE METABOLIC PANEL
ALT: 33 U/L (ref 0–44)
AST: 24 U/L (ref 15–41)
Albumin: 3.8 g/dL (ref 3.5–5.0)
Alkaline Phosphatase: 61 U/L (ref 38–126)
Anion gap: 8 (ref 5–15)
BUN: 15 mg/dL (ref 8–23)
CO2: 28 mmol/L (ref 22–32)
Calcium: 9.9 mg/dL (ref 8.9–10.3)
Chloride: 105 mmol/L (ref 98–111)
Creatinine, Ser: 0.54 mg/dL (ref 0.44–1.00)
GFR, Estimated: >60 mL/min (ref >60)
Glucose, Bld: 79 mg/dL (ref 70–99)
Potassium: 3.8 mmol/L (ref 3.5–5.1)
Sodium: 141 mmol/L (ref 135–145)
Total Bilirubin: 0.4 mg/dL (ref 0.3–1.2)
Total Protein: 6.7 g/dL (ref 6.5–8.1)

## 2022-04-21 LAB — TROPONIN I (HIGH SENSITIVITY)
Troponin I (High Sensitivity): 4 ng/L
Troponin I (High Sensitivity): 4 ng/L (ref <18)

## 2022-04-21 LAB — LIPASE, BLOOD: Lipase: 36 U/L (ref 11–51)

## 2022-04-21 MED ORDER — LIDOCAINE 5 % EX PTCH
1.0000 | MEDICATED_PATCH | CUTANEOUS | Status: DC
  Administered 2022-04-21: 1 via TRANSDERMAL
  Filled 2022-04-21: qty 1

## 2022-04-21 MED ORDER — KETOROLAC TROMETHAMINE 15 MG/ML IJ SOLN
15.0000 mg | Freq: Once | INTRAMUSCULAR | Status: AC
  Administered 2022-04-21: 15 mg via INTRAMUSCULAR
  Filled 2022-04-21: qty 1

## 2022-04-21 NOTE — ED Provider Notes (Signed)
[facility] Provider Note    Event Date/Time   First MD Initiated Contact with Patient 04/21/22 1718     (approximate)   History   Abdominal Pain   HPI  Dana Dickerson is a 61 y.o. female who presents today for evaluation of left-sided chest wall pain.  Patient reports that this began a couple of days ago.  She reports that she has been taking care of her husband who just had surgery, and has been lifting his legs and maneuvering him, and he weighs quite a bit.  She also reports that she recently recovered from a cold and when she was coughing quite a lot.  Her cough has since resolved, and there was no hemoptysis.  She tested negative for COVID.  She denies feeling short of breath.  She denies pain with deep inspiration.  She does not think that she has any pain in her abdomen.  She has not noticed any worsening pain after eating.  She reports that she only has pain when she pushes on the area or has truncal rotation or lifts something.  She denies any leg swelling or calf pain.  She has never had PE or DVT.  She reports that her pain is overall improving.  Patient Active Problem List   Diagnosis Date Noted   Varicose vein of leg 12/13/2021   Essential hypertension 01/28/2019   Encounter for well woman exam with routine gynecological exam 01/28/2019   Cystocele with incomplete uterovaginal prolapse 01/28/2019   Depression 01/28/2019   Osteopenia 02/12/2018   Prediabetes 01/28/2018   Hypercalcemia 12/24/2017   Elevated BP without diagnosis of hypertension 12/18/2017   Screening for colorectal cancer 12/18/2017   Encounter for gynecological examination with Papanicolaou smear of cervix 12/18/2017   Anxiety 12/25/2016   Primary osteoarthritis of first carpometacarpal joint of right hand 03/16/2016   Psoriasis, unspecified 03/16/2016   Constipation 04/21/2015   Grade 3 cystocele 04/21/2015          Physical Exam   Triage Vital Signs: ED Triage Vitals   Enc Vitals Group     BP 04/21/22 1558 (!) 176/86     Pulse Rate 04/21/22 1558 73     Resp 04/21/22 1558 18     Temp 04/21/22 1558 97.8 F (36.6 C)     Temp Source 04/21/22 1558 Oral     SpO2 04/21/22 1558 98 %     Weight 04/21/22 1558 179 lb (81.2 kg)     Height --      Head Circumference --      Peak Flow --      Pain Score 04/21/22 1557 6     Pain Loc --      Pain Edu? --      Excl. in [place]? --     Most recent vital signs: Vitals:   04/21/22 1810 04/21/22 1933  BP: (!) 150/78 (!) 146/79  Pulse: 73 74  Resp: 17 18  Temp: 98.4 F (36.9 C)   SpO2: 96% 96%    Physical Exam Vitals and nursing note reviewed.  Constitutional:      General: Awake and alert. No acute distress.    Appearance: Normal appearance. The patient is overweight.  HENT:     Head: Normocephalic and atraumatic.     Mouth: Mucous membranes are moist.  Eyes:     General: PERRL. Normal EOMs        Right eye: No discharge.  Left eye: No discharge.     Conjunctiva/sclera: Conjunctivae normal.  Cardiovascular:     Rate and Rhythm: Normal rate and regular rhythm.     Pulses: Normal pulses.     Heart sounds: Normal heart sounds Pulmonary:     Effort: Pulmonary effort is normal. No respiratory distress.     Breath sounds: Normal breath sounds.  Pinpoint tenderness to left anterior lower rib Abdominal:     Abdomen is soft. There is no abdominal tenderness. No rebound or guarding. No distention.  No splenomegaly or tenderness throughout the abdomen Musculoskeletal:        General: No swelling. Normal range of motion.     Cervical back: Normal range of motion and neck supple.  No leg swelling, no pitting edema Skin:    General: Skin is warm and dry.     Capillary Refill: Capillary refill takes less than 2 seconds.     Findings: No rash.  Neurological:     Mental Status: The patient is awake and alert.      ED Results / Procedures / Treatments   Labs (all labs ordered are listed, but only  abnormal results are displayed) Labs Reviewed  CBC WITH DIFFERENTIAL/PLATELET - Abnormal; Notable for the following components:      Result Value   Monocytes Absolute 1.1 (*)    All other components within normal limits  URINALYSIS, ROUTINE W REFLEX MICROSCOPIC - Abnormal; Notable for the following components:   Color, Urine YELLOW (*)    APPearance HAZY (*)    Leukocytes,Ua LARGE (*)    Bacteria, UA RARE (*)    All other components within normal limits  COMPREHENSIVE METABOLIC PANEL  LIPASE, BLOOD  TROPONIN I (HIGH SENSITIVITY)  TROPONIN I (HIGH SENSITIVITY)     EKG     RADIOLOGY I independently reviewed and interpreted imaging and agree with radiologists findings.     PROCEDURES:  Critical Care performed:   Procedures   MEDICATIONS ORDERED IN ED: Medications  lidocaine (LIDODERM) 5 % 1 patch (1 patch Transdermal Patch Applied 04/21/22 1754)  ketorolac (TORADOL) 15 MG/ML injection 15 mg (15 mg Intramuscular Given 04/21/22 1757)     IMPRESSION / MDM / ASSESSMENT AND PLAN / ED COURSE  I reviewed the triage vital signs and the nursing notes.   Differential diagnosis includes, but is not limited to, costochondritis, pleurodynia, pneumothorax, gastritis, peptic ulcer disease, acute coronary syndrome, pulmonary embolism.  Patient is awake and alert, hemodynamically stable and afebrile.  She has pinpoint tenderness to her left anterior rib.  I do suspect chest wall discomfort, likely caused by her lifting of her husband and her recent URI with forceful coughing.  Her work-up so far is reassuring.  Her labs are all within normal limits, including troponin x2.  She has no pain with deep inspiration, no radiation of pain, no clinical signs or symptoms of PE or DVT, no tachycardia or hypoxia, no history of PE or DVT, no recent hospitalizations, no exogenous hormone use.  Wells score 0.  She has no reproducible abdominal pain.  No urinary symptoms.  She does not report pain after  eating, she is negative Murphy sign, I do not suspect cholecystitis or biliary colic.  She has no splenomegaly or tenderness over her spleen or elsewhere throughout her abdomen. X-ray demonstrates possible pulmonary nodules.  Patient reports that she quit smoking 12 years ago.  Radiologist is however recommending a CT of her chest, I discussed with the patient and she would  like to get this done today.  CT chest demonstrates nodules with the recommendation to repeat the CT scan in 12 months, as well as nodules in her thyroid.  Patient reports that she already is aware of the thyroid nodules.  She understands recommendations for nonemergent ultrasound, as well as a repeat CT scan in 12 months.  She requested a prescription for the Lidoderm patches as this provided significant relief.  This was sent to her pharmacy.  We discussed tricked return precautions and the importance of close outpatient follow-up.  Patient understands and agrees with plan.  She was discharged in stable condition with her family members.   Patient's presentation is most consistent with acute complicated illness / injury requiring diagnostic workup.      FINAL CLINICAL IMPRESSION(S) / ED DIAGNOSES   Final diagnoses:  Rib pain on left side  Lung nodule seen on imaging study  Thyroid nodule     Rx / DC Orders   ED Discharge Orders     None        Note:  This document was prepared using Dragon voice recognition software and may include unintentional dictation errors.   Emeline Gins 04/21/22 2107    Merlyn Lot, MD 04/23/22 [phone]

## 2022-04-21 NOTE — ED Triage Notes (Signed)
Pt arrives with c/o left sided epigastric pain that started a few days ago. Pt endorses nausea. Pt denies SOB or fevers.

## 2022-04-21 NOTE — Discharge Instructions (Addendum)
Please continue to take Tylenol/ibuprofen per package instructions for what seems to be rib pain.  Your CT scan demonstrates a thyroid nodule and a lung nodule.  You should get an outpatient thyroid ultrasound, as well as a repeat CT of your chest in 12 months.  Please return for any new, worsening, or change in symptoms or other concerns.  It was a pleasure caring for you today.

## 2022-05-03 ENCOUNTER — Ambulatory Visit (HOSPITAL_COMMUNITY): Payer: Managed Care, Other (non HMO)

## 2022-05-04 ENCOUNTER — Ambulatory Visit (HOSPITAL_COMMUNITY)
Admission: RE | Admit: 2022-05-04 | Discharge: 2022-05-04 | Disposition: A | Discharge status: Home / Self Care | Payer: Managed Care, Other (non HMO) | Source: Ambulatory Visit | Attending: Obstetrics & Gynecology | Admitting: Obstetrics & Gynecology

## 2022-05-04 DIAGNOSIS — Z1231 Encounter for screening mammogram for malignant neoplasm of breast: Secondary | ICD-10-CM | POA: Diagnosis present

## 2022-06-13 ENCOUNTER — Encounter: Payer: Self-pay | Admitting: Adult Health

## 2022-06-13 ENCOUNTER — Ambulatory Visit (INDEPENDENT_AMBULATORY_CARE_PROVIDER_SITE_OTHER): Payer: Managed Care, Other (non HMO) | Admitting: Adult Health

## 2022-06-13 VITALS — BP 139/81 | HR 71 | Ht 64.0 in | Wt 182.0 lb

## 2022-06-13 DIAGNOSIS — Z4689 Encounter for fitting and adjustment of other specified devices: Secondary | ICD-10-CM | POA: Insufficient documentation

## 2022-06-13 DIAGNOSIS — N898 Other specified noninflammatory disorders of vagina: Secondary | ICD-10-CM | POA: Diagnosis not present

## 2022-06-13 DIAGNOSIS — N812 Incomplete uterovaginal prolapse: Secondary | ICD-10-CM | POA: Diagnosis not present

## 2022-06-13 DIAGNOSIS — Z87440 Personal history of urinary (tract) infections: Secondary | ICD-10-CM | POA: Diagnosis not present

## 2022-06-13 LAB — POCT URINALYSIS DIPSTICK
Blood, UA: NEGATIVE
Glucose, UA: NEGATIVE
Ketones, UA: NEGATIVE
Nitrite, UA: NEGATIVE
Protein, UA: NEGATIVE

## 2022-06-13 MED ORDER — METRONIDAZOLE 0.75 % VA GEL: 1.0000 | Freq: Every day | VAGINAL | 1 refills | Status: DC

## 2022-06-13 NOTE — Progress Notes (Signed)
  Subjective:     Patient ID: Dana Dickerson, female   DOB: December 10, 1960, 61 y.o.   MRN: [id number]  HPI Dana Dickerson is a 61 year old white female, married, PM in for pessary maintenance and has odor and discharge. She wants urine checked, history of UTI.  Last pap was 05/04/20, negative HPV and malignancy.   Review of Systems For pessary maintenance Has noticed odor and discharge Hx UTI Reviewed past medical,surgical, social and family history. Reviewed medications and allergies.     Objective:   Physical Exam BP 139/81 (BP Location: Left Arm, Patient Position: Sitting, Cuff Size: Normal)   Pulse 71   Ht '5\' 4"'$  (1.626 m)   Wt 182 lb (82.6 kg)   BMI 31.24 kg/m   urine dipstick was small leuks Skin warm and dry.Pelvic: external genitalia is normal in appearance no lesions, vagina: pessary removed, has creamy discharge with odor, washed with soap and water and dried, no vaginal irritation,+cystocele and prolapse, pessary easily reinserted,urethra has no lesions or masses noted, cervix:smooth and bulbous, uterus: normal size, shape and contour, non tender, no masses felt, adnexa: no masses or tenderness noted. Bladder is non tender and no masses felt. Fall risk is low  Upstream - 06/13/22 1026       Pregnancy Intention Screening   Does the patient want to become pregnant in the next year? N/A    Does the patient's partner want to become pregnant in the next year? N/A    Would the patient like to discuss contraceptive options today? N/A      Contraception Wrap Up   Current Method Abstinence    End Method Abstinence    Contraception Counseling Provided No            Examination chaperoned by Levy Pupa LPN    Assessment:     1. History of UTI  2. Pessary maintenance, Milex ring with support #4, OF 06/08/20  3. Cystocele with incomplete uterovaginal prolapse She has seen Dr Wannetta Sender and is thinking about surgery  4. Vaginal odor Will rx Metrogel Meds ordered this encounter   Medications   metroNIDAZOLE (METROGEL) 0.75 % vaginal gel    Sig: Place 1 Applicatorful vaginally at bedtime.    Dispense:  70 g    Refill:  1    Order Specific Question:   Supervising Provider    Answer:   EURE, LUTHER H [[id number]]     5. Vaginal discharge Will rx Metrogel     Plan:     Follow up in 4 months for pessary maintenance

## 2022-07-11 IMAGING — MG MM DIGITAL SCREENING BILAT W/ TOMO AND CAD
6 of 10 series · 6 of 30 positions shown · non-contrast
Comparison: Previous exam(s).

ACR Breast Density Category a: The breast tissue is almost entirely
fatty.

CLINICAL DATA: Screening.

EXAM:
DIGITAL SCREENING BILATERAL MAMMOGRAM WITH TOMOSYNTHESIS AND CAD
TECHNIQUE: Bilateral screening digital craniocaudal and mediolateral oblique
mammograms were obtained. Bilateral screening digital breast
tomosynthesis was performed. The images were evaluated with
computer-aided detection.

[L MLO synth-2D (1 of 2)]
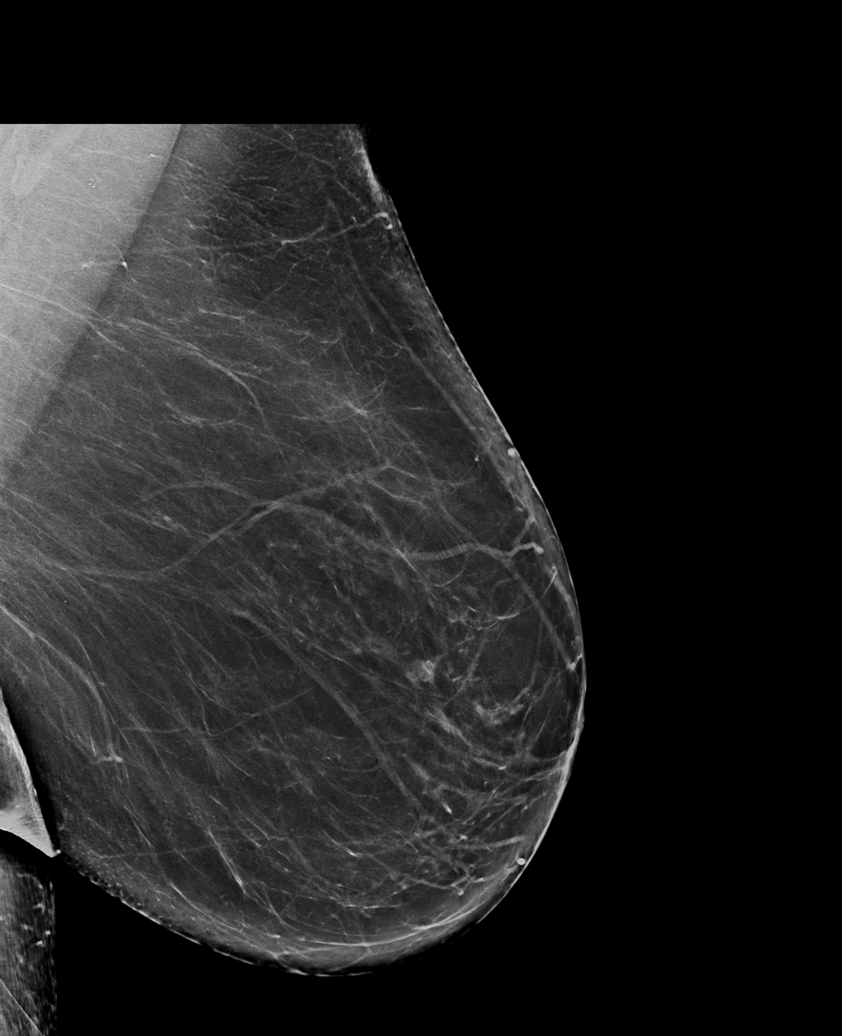

[R MLO synth-2D]
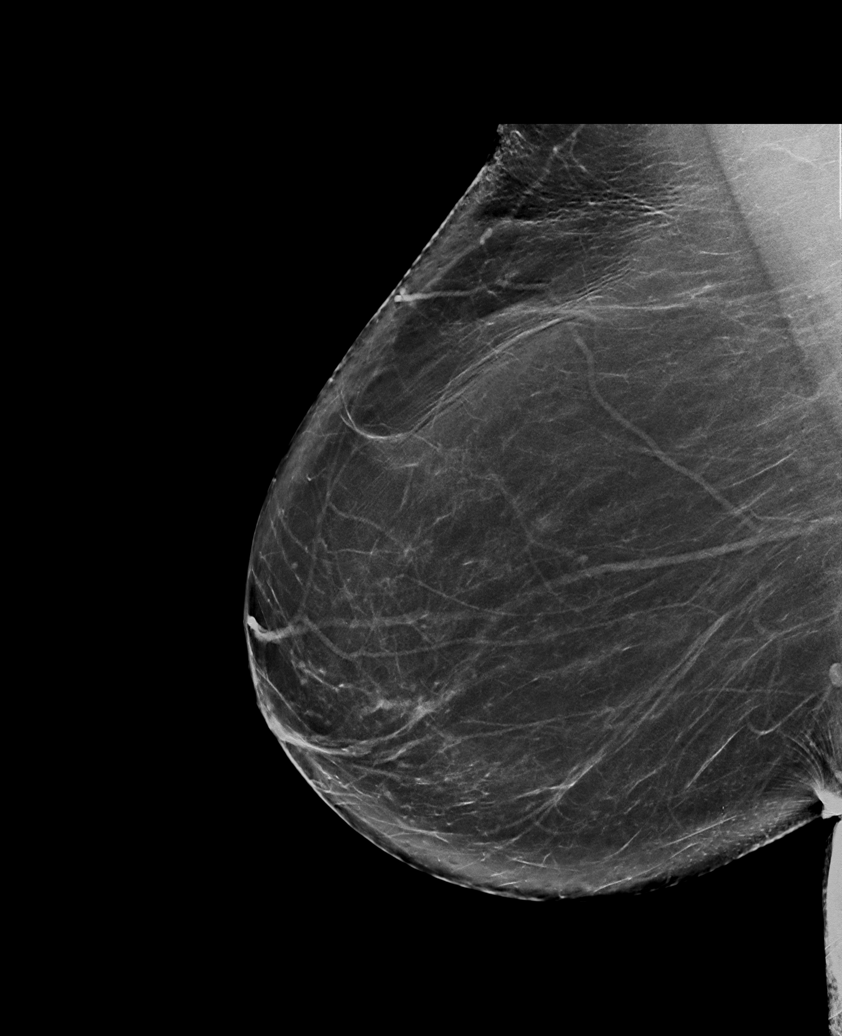

[L CC synth-2D]
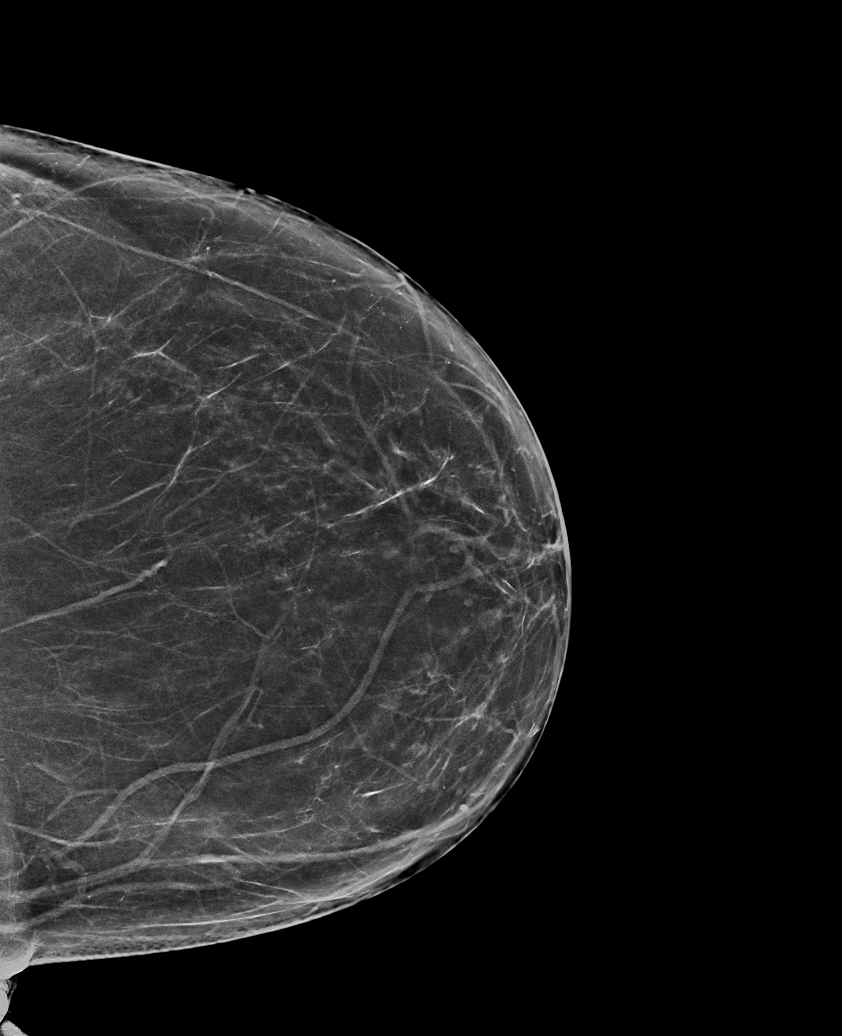

[L MLO synth-2D (2 of 2)]
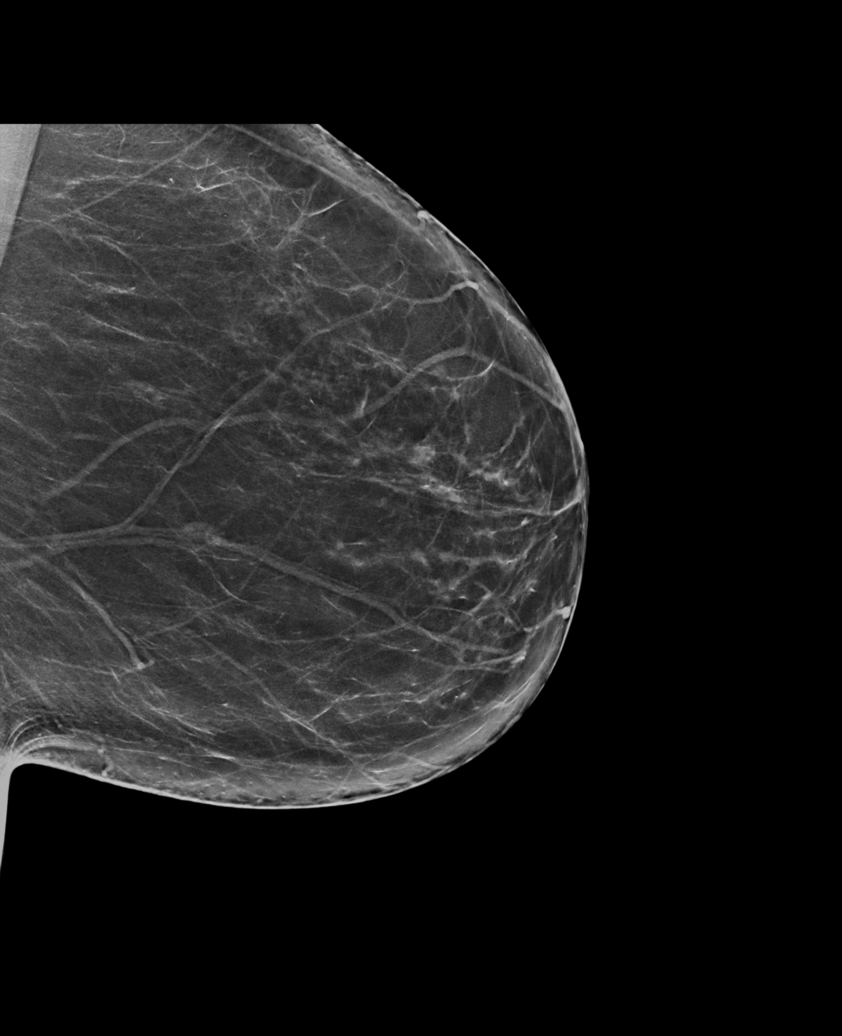

[R CC synth-2D]
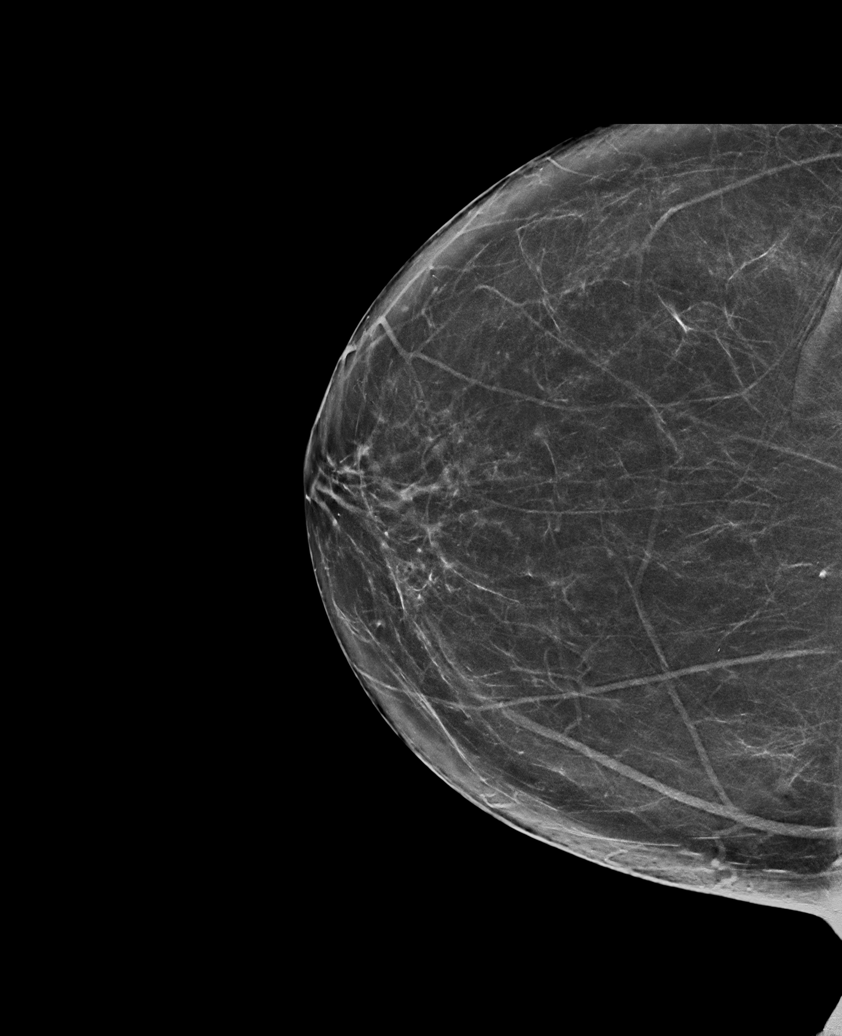

[L CC tomo · tomo slice 35/70.0]
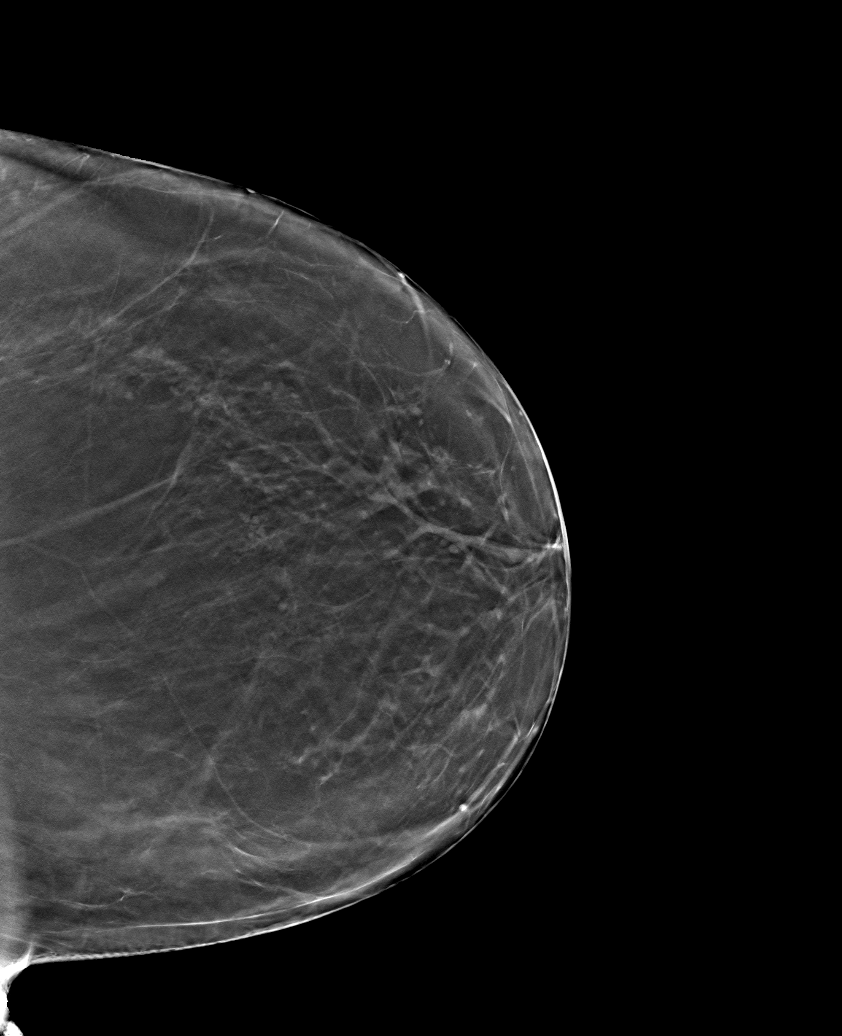

[6 of 30 positions shown; findings below may reference images not displayed]

FINDINGS: There are no findings suspicious for malignancy.
IMPRESSION: No mammographic evidence of malignancy. A result letter of this
screening mammogram will be mailed directly to the patient.

RECOMMENDATION:
Screening mammogram in one year. (Code:0E-3-N98)

BI-RADS CATEGORY  1: Negative.

## 2022-08-07 ENCOUNTER — Ambulatory Visit: Payer: Managed Care, Other (non HMO) | Admitting: Obstetrics & Gynecology

## 2022-09-18 ENCOUNTER — Other Ambulatory Visit (HOSPITAL_COMMUNITY)
Admission: RE | Admit: 2022-09-18 | Discharge: 2022-09-18 | Disposition: A | Discharge status: Home / Self Care | Payer: Managed Care, Other (non HMO) | Source: Ambulatory Visit | Attending: Obstetrics & Gynecology | Admitting: Obstetrics & Gynecology

## 2022-09-18 ENCOUNTER — Ambulatory Visit: Payer: Managed Care, Other (non HMO) | Admitting: Obstetrics & Gynecology

## 2022-09-18 ENCOUNTER — Encounter: Payer: Self-pay | Admitting: Obstetrics & Gynecology

## 2022-09-18 VITALS — BP 157/90 | HR 72 | Ht 64.0 in | Wt 185.8 lb

## 2022-09-18 DIAGNOSIS — Z01419 Encounter for gynecological examination (general) (routine) without abnormal findings: Secondary | ICD-10-CM | POA: Insufficient documentation

## 2022-09-18 NOTE — Progress Notes (Signed)
WELL-WOMAN EXAMINATION Patient name: Dana Dickerson MRN [id number]  Date of birth: [birth year] Chief Complaint:   Annual Exam  History of Present Illness:   Dana Dickerson is a 62 y.o. [id number] PM female being seen today for a routine well-woman exam.   -Cystocele: doing ok with cystocele, pessary in place.  Seen by Dr. Wannetta Sender, considering surgery in the future   No LMP recorded. Patient is postmenopausal. Denies issues with her menses The current method of family planning is post menopausal status.    Last pap 2021.  Last mammogram: 04/2022. Last colonoscopy: 2018     09/18/2022   11:02 AM 06/13/2021   11:29 AM 05/04/2020   10:04 AM 01/28/2019    2:31 PM 07/01/2018    2:14 PM  Depression screen PHQ 2/9  Decreased Interest 1 1 1 3  0  Down, Depressed, Hopeless 2 1 1 3  0  PHQ - 2 Score 3 2 2 6  0  Altered sleeping 1 0 1 0   Tired, decreased energy 1 1 1  0   Change in appetite 1 1 1  0   Feeling bad or failure about yourself  1 1 0 0   Trouble concentrating 0 0 0 0   Moving slowly or fidgety/restless 0 0 0 0   Suicidal thoughts 0 0 0 0   PHQ-9 Score 7 5 5 6    Difficult doing work/chores   Not difficult at all        Review of Systems:   Pertinent items are noted in HPI Denies any headaches, blurred vision, fatigue, shortness of breath, chest pain, abdominal pain, bowel movements, urination, or intercourse unless otherwise stated above.  Pertinent History Reviewed:  Reviewed past medical,surgical, social and family history.  Reviewed problem list, medications and allergies. Physical Assessment:   Vitals:   09/18/22 1106  BP: (!) 157/90  Pulse: 72  Weight: 185 lb 12.8 oz (84.3 kg)  Height: 5\' 4"  (1.626 m)  Body mass index is 31.89 kg/m.        Physical Examination:   General appearance - well appearing, and in no distress  Mental status - alert, oriented to person, place, and time  Psych:  She has a normal mood and affect  Skin - warm and dry, normal color, no  suspicious lesions noted  Chest - effort normal, all lung fields clear to auscultation bilaterally  Heart - normal rate and regular rhythm  Neck:  midline trachea, no thyromegaly or nodules  Breasts - breasts appear normal, no suspicious masses, no skin or nipple changes or  axillary nodes  Abdomen - soft, nontender, nondistended, no masses or organomegaly  Pelvic - VULVA: normal appearing vulva with no masses, tenderness or lesions  VAGINA: normal appearing vagina with normal color and discharge, no lesions  CERVIX: normal appearing cervix without discharge or lesions, no CMT.  Stage 3 prolapse.  Pessary removed, cleaned and replaced.  No evidence of   Thin prep pap is done with HR HPV cotesting  UTERUS: uterus is felt to be normal size, shape, consistency and nontender   ADNEXA: No adnexal masses or tenderness noted.  Extremities:  No swelling or varicosities noted  Chaperone: Latisha Cresenzo     Assessment & Plan:  1) Well-Woman Exam -pap collected, reviewed screening guidelines -mammogram due in Nov.  2) Prolapse No change to pessary Maintenance q 58mos Surgical intervention in the future with Dr. Wannetta Sender  Meds: No orders of the defined types were placed in this  encounter.   Follow-up: Return in about 6 months (around 03/21/2023) for pessary maintenance (Keylee Shrestha).   Janyth Pupa, DO Attending [place], [facility] for [employer], [place]

## 2022-09-18 NOTE — Addendum Note (Signed)
Addended by: Octaviano Glow on: 09/18/2022 12:14 PM   Modules accepted: Orders

## 2022-09-20 LAB — CYTOLOGY - PAP
Comment: NEGATIVE
Diagnosis: NEGATIVE
High risk HPV: NEGATIVE

## 2023-02-02 ENCOUNTER — Encounter: Payer: Self-pay | Admitting: Gastroenterology

## 2023-02-11 NOTE — H&P (Signed)
Pre-Procedure H&P   Patient ID: Dana Dickerson is a 62 y.o. female.  Gastroenterology Provider: Jaynie Collins, DO  Referring Provider: Fransico Setters, NP PCP: Lynnea Ferrier, MD  Date: 02/12/2023  HPI Ms. Dana Dickerson is a 62 y.o. female who presents today for Colonoscopy for Surveillance-personal history of colon polyps .  Patient last underwent colonoscopy in June 2018 demonstrating 5 adenomatous polyps.  She also had a perisigmoid mass and underwent endoscopic ultrasound in October 2018 with resection of leiomyoma in March 2019.  Currently with 1-2 bowel movements daily without melena or hematochezia On tirzepatide which has been held for this procedure for 7 days Hemoglobin 15 MCV 89 platelets 219,000 creatinine 0.61  Strong tobacco history with 60 pack years No family history of colon cancer or colon polyps    Past Medical History:  Diagnosis Date   Anxiety    Arthritis    Hypertension    Hyperthyroidism    Pre-diabetes    Psoriasis    Serum calcium elevated 12/24/2017   Refer to Dr Fransico Him   Thyroid cyst    Uterine leiomyoma    Uterovaginal prolapse    Varicose veins of both lower extremities     Past Surgical History:  Procedure Laterality Date   COLONOSCOPY WITH PROPOFOL N/A 12/15/2016   Procedure: COLONOSCOPY WITH PROPOFOL;  Surgeon: Wyline Mood, MD;  Location: [facility] ENDOSCOPY;  Service: Endoscopy;  Laterality: N/A;   DIAGNOSTIC LAPAROSCOPY     DILATION AND CURETTAGE OF UTERUS     laparoscopic leiomyoma removed from presacral space  09/19/2017   RECTAL EUS     SACROCOCCYGEAL ULCER REMOVAL     TUBAL LIGATION      Family History No h/o GI disease or malignancy  Review of Systems  Constitutional:  Negative for activity change, appetite change, chills, diaphoresis, fatigue, fever and unexpected weight change.  HENT:  Negative for trouble swallowing and voice change.   Respiratory:  Negative for shortness of breath and wheezing.   Cardiovascular:   Negative for chest pain, palpitations and leg swelling.  Gastrointestinal:  Negative for abdominal distention, abdominal pain, anal bleeding, blood in stool, constipation, diarrhea, nausea, rectal pain and vomiting.  Musculoskeletal:  Negative for arthralgias and myalgias.  Skin:  Negative for color change and pallor.  Neurological:  Negative for dizziness, syncope and weakness.  Psychiatric/Behavioral:  Negative for confusion.   All other systems reviewed and are negative.    Medications No current facility-administered medications on file prior to encounter.   Current Outpatient Medications on File Prior to Encounter  Medication Sig Dispense Refill   Apoaequorin (PREVAGEN) 10 MG CAPS Take by mouth as directed.     Ascorbic Acid (VITAMIN C) 100 MG tablet Take 100 mg by mouth daily.     Budeson-Glycopyrrol-Formoterol (BREZTRI AEROSPHERE) 160-9-4.8 MCG/ACT AERO Inhale 2 puffs into the lungs in the morning and at bedtime.     buPROPion (WELLBUTRIN XL) 150 MG 24 hr tablet TAKE 1 TABLET BY MOUTH  DAILY 90 tablet 3   Clobetasol Propionate (TEMOVATE) 0.05 % external spray Apply topically.     ELDERBERRY PO Take by mouth daily.     ibuprofen (ADVIL,MOTRIN) 200 MG tablet Take 200 mg by mouth every 6 (six) hours as needed.     lisinopril-hydrochlorothiazide (ZESTORETIC) 20-12.5 MG tablet Take 1 tablet by mouth daily.     MELATONIN PO Take by mouth daily.     methimazole (TAPAZOLE) 5 MG tablet Take 5 mg by  mouth 3 (three) times daily.     Multiple Vitamin (MULTIVITAMIN) tablet Take 1 tablet by mouth daily.     Na Sulfate-K Sulfate-Mg Sulf (SUPREP BOWEL PREP KIT) 17.5-3.13-1.6 GM/177ML SOLN Take 1 Bottle by mouth as directed.     omeprazole (PRILOSEC) 40 MG capsule Take by mouth.     tirzepatide (ZEPBOUND) 2.5 MG/0.5ML Pen Inject 2.5 mg into the skin once a week.     triamcinolone ointment (KENALOG) 0.1 % Apply topically 2 (two) times daily.     zinc gluconate 50 MG tablet Take 50 mg by mouth  daily.     Acetaminophen (TYLENOL PO) Take by mouth.     metroNIDAZOLE (METROGEL) 0.75 % vaginal gel Place 1 Applicatorful vaginally at bedtime. (Patient not taking: Reported on 09/18/2022) 70 g 1    Pertinent medications related to GI and procedure were reviewed by me with the patient prior to the procedure   Current Facility-Administered Medications:    0.9 %  sodium chloride infusion, , Intravenous, Continuous, Jaynie Collins, DO, Last Rate: 20 mL/hr at 02/12/23 0839, 20 mL/hr at 02/12/23 0839  sodium chloride 20 mL/hr (02/12/23 0839)       Allergies  Allergen Reactions   Codeine Shortness Of Breath, Anaphylaxis, Hives and Rash   Amoxicillin Itching   Ampicillin Rash   Penicillins Hives, Itching and Rash   Allergies were reviewed by me prior to the procedure  Objective   Body mass index is 29.12 kg/m. Vitals:   02/12/23 0829  BP: (!) 159/74  Pulse: 61  Resp: 20  Temp: 98.2 F (36.8 C)  TempSrc: Temporal  SpO2: 100%  Weight: 79.4 kg  Height: 5\' 5"  (1.651 m)     Physical Exam Vitals and nursing note reviewed.  Constitutional:      General: She is not in acute distress.    Appearance: Normal appearance. She is not ill-appearing, toxic-appearing or diaphoretic.  HENT:     Head: Normocephalic and atraumatic.     Nose: Nose normal.     Mouth/Throat:     Mouth: Mucous membranes are moist.     Pharynx: Oropharynx is clear.  Eyes:     General: No scleral icterus.    Extraocular Movements: Extraocular movements intact.  Cardiovascular:     Rate and Rhythm: Normal rate and regular rhythm.     Heart sounds: Normal heart sounds. No murmur heard.    No friction rub. No gallop.  Pulmonary:     Effort: Pulmonary effort is normal. No respiratory distress.     Breath sounds: Normal breath sounds. No wheezing, rhonchi or rales.  Abdominal:     General: Bowel sounds are normal. There is no distension.     Palpations: Abdomen is soft.     Tenderness: There is no  abdominal tenderness. There is no guarding or rebound.  Musculoskeletal:     Cervical back: Neck supple.     Right lower leg: No edema.     Left lower leg: No edema.  Skin:    General: Skin is warm and dry.     Coloration: Skin is not jaundiced or pale.  Neurological:     General: No focal deficit present.     Mental Status: She is alert and oriented to person, place, and time. Mental status is at baseline.  Psychiatric:        Mood and Affect: Mood normal.        Behavior: Behavior normal.  Thought Content: Thought content normal.        Judgment: Judgment normal.      Assessment:  Ms. Dana Dickerson is a 62 y.o. female  who presents today for Colonoscopy for Surveillance-personal history of colon polyps .  Plan:  Colonoscopy with possible intervention today  Colonoscopy with possible biopsy, control of bleeding, polypectomy, and interventions as necessary has been discussed with the patient/patient representative. Informed consent was obtained from the patient/patient representative after explaining the indication, nature, and risks of the procedure including but not limited to death, bleeding, perforation, missed neoplasm/lesions, cardiorespiratory compromise, and reaction to medications. Opportunity for questions was given and appropriate answers were provided. Patient/patient representative has verbalized understanding is amenable to undergoing the procedure.   Jaynie Collins, DO  [facility] Gastroenterology  Portions of the record may have been created with voice recognition software. Occasional wrong-word or 'sound-a-like' substitutions may have occurred due to the inherent limitations of voice recognition software.  Read the chart carefully and recognize, using context, where substitutions may have occurred.

## 2023-02-12 ENCOUNTER — Encounter: Payer: Self-pay | Admitting: Gastroenterology

## 2023-02-12 ENCOUNTER — Ambulatory Visit
Admission: RE | Admit: 2023-02-12 | Discharge: 2023-02-12 | Disposition: A | Discharge status: Home / Self Care | Payer: Managed Care, Other (non HMO) | Source: Ambulatory Visit | Attending: Gastroenterology | Admitting: Gastroenterology

## 2023-02-12 ENCOUNTER — Ambulatory Visit: Payer: Managed Care, Other (non HMO) | Admitting: Certified Registered"

## 2023-02-12 ENCOUNTER — Encounter
Admission: RE | Disposition: A | Discharge status: Home / Self Care | Payer: Self-pay | Source: Ambulatory Visit | Attending: Gastroenterology

## 2023-02-12 DIAGNOSIS — Z79899 Other long term (current) drug therapy: Secondary | ICD-10-CM | POA: Diagnosis not present

## 2023-02-12 DIAGNOSIS — F418 Other specified anxiety disorders: Secondary | ICD-10-CM | POA: Diagnosis not present

## 2023-02-12 DIAGNOSIS — D122 Benign neoplasm of ascending colon: Secondary | ICD-10-CM | POA: Diagnosis not present

## 2023-02-12 DIAGNOSIS — K641 Second degree hemorrhoids: Secondary | ICD-10-CM | POA: Diagnosis not present

## 2023-02-12 DIAGNOSIS — Z87891 Personal history of nicotine dependence: Secondary | ICD-10-CM | POA: Insufficient documentation

## 2023-02-12 DIAGNOSIS — Z09 Encounter for follow-up examination after completed treatment for conditions other than malignant neoplasm: Secondary | ICD-10-CM | POA: Diagnosis not present

## 2023-02-12 DIAGNOSIS — D125 Benign neoplasm of sigmoid colon: Secondary | ICD-10-CM | POA: Diagnosis not present

## 2023-02-12 DIAGNOSIS — E0590 Thyrotoxicosis, unspecified without thyrotoxic crisis or storm: Secondary | ICD-10-CM | POA: Insufficient documentation

## 2023-02-12 DIAGNOSIS — Z86018 Personal history of other benign neoplasm: Secondary | ICD-10-CM | POA: Insufficient documentation

## 2023-02-12 DIAGNOSIS — Z86010 Personal history of colonic polyps: Secondary | ICD-10-CM | POA: Insufficient documentation

## 2023-02-12 DIAGNOSIS — I10 Essential (primary) hypertension: Secondary | ICD-10-CM | POA: Diagnosis not present

## 2023-02-12 DIAGNOSIS — Z1211 Encounter for screening for malignant neoplasm of colon: Secondary | ICD-10-CM | POA: Insufficient documentation

## 2023-02-12 HISTORY — DX: Leiomyoma of uterus, unspecified: D25.9

## 2023-02-12 HISTORY — PX: POLYPECTOMY: SHX5525

## 2023-02-12 HISTORY — DX: Unspecified osteoarthritis, unspecified site: M19.90

## 2023-02-12 HISTORY — DX: Prediabetes: R73.03

## 2023-02-12 HISTORY — PX: COLONOSCOPY WITH PROPOFOL: SHX5780

## 2023-02-12 HISTORY — DX: Uterovaginal prolapse, unspecified: N81.4

## 2023-02-12 HISTORY — DX: Asymptomatic varicose veins of bilateral lower extremities: I83.93

## 2023-02-12 HISTORY — DX: Psoriasis, unspecified: L40.9

## 2023-02-12 SURGERY — COLONOSCOPY WITH PROPOFOL
Anesthesia: General

## 2023-02-12 MED ORDER — SODIUM CHLORIDE 0.9 % IV SOLN
INTRAVENOUS | Status: DC
  Administered 2023-02-12: 20 mL/h via INTRAVENOUS

## 2023-02-12 MED ORDER — PROPOFOL 10 MG/ML IV BOLUS
INTRAVENOUS | Status: DC | PRN
  Administered 2023-02-12: 150 ug/kg/min via INTRAVENOUS
  Administered 2023-02-12: 50 mg via INTRAVENOUS

## 2023-02-12 MED ORDER — LIDOCAINE HCL (CARDIAC) PF 100 MG/5ML IV SOSY
PREFILLED_SYRINGE | INTRAVENOUS | Status: DC | PRN
  Administered 2023-02-12: 100 mg via INTRAVENOUS

## 2023-02-12 NOTE — Op Note (Signed)
[facility] Gastroenterology Patient Name: Dana Dickerson Procedure Date: 02/12/2023 9:05 AM MRN: [id number] Account #: [id number] Date of Birth: December 30, 1960 Admit Type: Outpatient Age: 62 Room: [facility] ENDO ROOM 2 Gender: Female Note Status: Finalized Instrument Name: Peds Colonoscope [id number] Procedure:             Colonoscopy Indications:           High risk colon cancer surveillance: Personal history                         of colonic polyps Providers:             Jaynie Collins DO, DO Medicines:             Monitored Anesthesia Care Complications:         No immediate complications. Estimated blood loss:                         Minimal. Procedure:             Pre-Anesthesia Assessment:                        - Prior to the procedure, a History and Physical was                         performed, and patient medications and allergies were                         reviewed. The patient is competent. The risks and                         benefits of the procedure and the sedation options and                         risks were discussed with the patient. All questions                         were answered and informed consent was obtained.                         Patient identification and proposed procedure were                         verified by the physician, the nurse, the anesthetist                         and the technician in the endoscopy suite. Mental                         Status Examination: alert and oriented. Airway                         Examination: normal oropharyngeal airway and neck                         mobility. Respiratory Examination: clear to                         auscultation. CV Examination: RRR, no murmurs, no S3  or S4. Prophylactic Antibiotics: The patient does not                         require prophylactic antibiotics. Prior                         Anticoagulants: The patient has taken no anticoagulant                          or antiplatelet agents. ASA Grade Assessment: II - A                         patient with mild systemic disease. After reviewing                         the risks and benefits, the patient was deemed in                         satisfactory condition to undergo the procedure. The                         anesthesia plan was to use monitored anesthesia care                         (MAC). Immediately prior to administration of                         medications, the patient was re-assessed for adequacy                         to receive sedatives. The heart rate, respiratory                         rate, oxygen saturations, blood pressure, adequacy of                         pulmonary ventilation, and response to care were                         monitored throughout the procedure. The physical                         status of the patient was re-assessed after the                         procedure.                        After obtaining informed consent, the colonoscope was                         passed under direct vision. Throughout the procedure,                         the patient's blood pressure, pulse, and oxygen                         saturations were monitored continuously. The  Colonoscope was introduced through the anus and                         advanced to the the cecum, identified by appendiceal                         orifice and ileocecal valve. The colonoscopy was                         performed without difficulty. The patient tolerated                         the procedure well. The quality of the bowel                         preparation was evaluated using the BBPS [facility] Bowel                         Preparation Scale) with scores of: Right Colon = 3,                         Transverse Colon = 3 and Left Colon = 3 (entire mucosa                         seen well with no residual staining, small fragments                          of stool or opaque liquid). The total BBPS score                         equals 9. The ileocecal valve, appendiceal orifice,                         and rectum were photographed. Findings:      Hemorrhoids were found on perianal exam.      Retroflexion in the right colon was performed.      Non-bleeding internal hemorrhoids were found during retroflexion and       during perianal exam. The hemorrhoids were Grade II (internal       hemorrhoids that prolapse but reduce spontaneously). Estimated blood       loss: none.      Anal papilla(e) were hypertrophied. Estimated blood loss: none.      Three sessile polyps were found in the sigmoid colon (2) and ascending       colon (1). The polyps were 1 to 2 mm in size. These polyps were removed       with a jumbo cold forceps. Resection and retrieval were complete.       Estimated blood loss was minimal.      The exam was otherwise without abnormality on direct and retroflexion       views. Impression:            - Hemorrhoids found on perianal exam.                        - Non-bleeding internal hemorrhoids.                        -  Anal papilla(e) were hypertrophied.                        - Three 1 to 2 mm polyps in the sigmoid colon and in                         the ascending colon, removed with a jumbo cold                         forceps. Resected and retrieved.                        - The examination was otherwise normal on direct and                         retroflexion views. Recommendation:        - Patient has a contact number available for                         emergencies. The signs and symptoms of potential                         delayed complications were discussed with the patient.                         Return to normal activities tomorrow. Written                         discharge instructions were provided to the patient.                        - Discharge patient to home.                        - Resume previous  diet.                        - Continue present medications.                        - Await pathology results.                        - Repeat colonoscopy for surveillance based on                         pathology results.                        - Return to referring physician as previously                         scheduled.                        - The findings and recommendations were discussed with                         the patient. Procedure Code(s):     --- Professional ---  13244, Colonoscopy, flexible; with biopsy, single or                         multiple Diagnosis Code(s):     --- Professional ---                        Z86.010, Personal history of colonic polyps                        K62.89, Other specified diseases of anus and rectum                        D12.5, Benign neoplasm of sigmoid colon                        D12.2, Benign neoplasm of ascending colon                        K64.1, Second degree hemorrhoids CPT copyright 2022 American Medical Association. All rights reserved. The codes documented in this report are preliminary and upon coder review may  be revised to meet current compliance requirements. Attending Participation:      I personally performed the entire procedure. Elfredia Nevins, DO Jaynie Collins DO, DO 02/12/2023 9:40:45 AM This report has been signed electronically. Number of Addenda: 0 Note Initiated On: 02/12/2023 9:05 AM Scope Withdrawal Time: 0 hours 12 minutes 28 seconds  Total Procedure Duration: 0 hours 15 minutes 29 seconds  Estimated Blood Loss:  Estimated blood loss was minimal.      [facility]

## 2023-02-12 NOTE — Interval H&P Note (Signed)
History and Physical Interval Note: Preprocedure H&P from 02/12/23  was reviewed and there was no interval change after seeing and examining the patient.  Written consent was obtained from the patient after discussion of risks, benefits, and alternatives. Patient has consented to proceed with Colonoscopy with possible intervention   02/12/2023 9:04 AM  [name]  has presented today for surgery, with the diagnosis of V12.72 (ICD-9-CM) - Z86.010 (ICD-10-CM) - Hx of adenomatous colonic polyps.  The various methods of treatment have been discussed with the patient and family. After consideration of risks, benefits and other options for treatment, the patient has consented to  Procedure(s): COLONOSCOPY WITH PROPOFOL (N/A) as a surgical intervention.  The patient's history has been reviewed, patient examined, no change in status, stable for surgery.  I have reviewed the patient's chart and labs.  Questions were answered to the patient's satisfaction.     Dana Dickerson

## 2023-02-12 NOTE — Anesthesia Preprocedure Evaluation (Signed)
Anesthesia Evaluation  Patient identified by MRN, date of birth, ID band Patient awake    Reviewed: Allergy & Precautions, NPO status , Patient's Chart, lab work & pertinent test results  History of Anesthesia Complications Negative for: history of anesthetic complications  Airway Mallampati: II  TM Distance: >3 FB Neck ROM: Full    Dental  (+) Upper Dentures, Partial Lower, Dental Advidsory Given   Pulmonary neg shortness of breath, neg sleep apnea, neg COPD, neg recent URI, former smoker   breath sounds clear to auscultation- rhonchi (-) wheezing      Cardiovascular Exercise Tolerance: Good hypertension, (-) angina (-) CAD, (-) Past MI and (-) Cardiac Stents (-) dysrhythmias (-) Valvular Problems/Murmurs Rhythm:Regular Rate:Normal - Systolic murmurs and - Diastolic murmurs    Neuro/Psych  PSYCHIATRIC DISORDERS Anxiety Depression    negative neurological ROS     GI/Hepatic negative GI ROS, Neg liver ROS,,,  Endo/Other  neg diabetes Hyperthyroidism   Renal/GU negative Renal ROS     Musculoskeletal negative musculoskeletal ROS (+)    Abdominal  (+) + obese  Peds  Hematology negative hematology ROS (+)   Anesthesia Other Findings Past Medical History: No date: Anxiety No date: Hyperthyroidism No date: Thyroid cyst   Reproductive/Obstetrics                             Anesthesia Physical Anesthesia Plan  ASA: 2  Anesthesia Plan: General   Post-op Pain Management:    Induction: Intravenous  PONV Risk Score and Plan: 3 and Propofol infusion and TIVA  Airway Management Planned: Natural Airway and Nasal Cannula  Additional Equipment:   Intra-op Plan:   Post-operative Plan:   Informed Consent: I have reviewed the patients History and Physical, chart, labs and discussed the procedure including the risks, benefits and alternatives for the proposed anesthesia with the patient or  authorized representative who has indicated his/her understanding and acceptance.     Dental advisory given  Plan Discussed with: CRNA and Anesthesiologist  Anesthesia Plan Comments:         Anesthesia Quick Evaluation

## 2023-02-12 NOTE — Transfer of Care (Signed)
Immediate Anesthesia Transfer of Care Note  Patient: Dana Dickerson  Procedure(s) Performed: Procedure(s): COLONOSCOPY WITH PROPOFOL (N/A)  Patient Location: PACU and Endoscopy Unit  Anesthesia Type:General  Level of Consciousness: sedated  Airway & Oxygen Therapy: Patient Spontanous Breathing and Patient connected to nasal cannula oxygen  Post-op Assessment: Report given to RN and Post -op Vital signs reviewed and stable  Post vital signs: Reviewed and stable  Last Vitals:  Vitals:   02/12/23 0829 02/12/23 0938  BP: (!) 159/74 (!) 101/51  Pulse: 61 71  Resp: 20 (!) 24  Temp: 36.8 C 36.7 C  SpO2: 100% 100%    Complications: No apparent anesthesia complications

## 2023-02-13 ENCOUNTER — Encounter: Payer: Self-pay | Admitting: Gastroenterology

## 2023-02-21 NOTE — Anesthesia Postprocedure Evaluation (Signed)
Anesthesia Post Note  Patient: Dana Dickerson  Procedure(s) Performed: COLONOSCOPY WITH PROPOFOL POLYPECTOMY  Patient location during evaluation: Endoscopy Anesthesia Type: General Level of consciousness: awake and alert Pain management: pain level controlled Vital Signs Assessment: post-procedure vital signs reviewed and stable Respiratory status: spontaneous breathing, nonlabored ventilation, respiratory function stable and patient connected to nasal cannula oxygen Cardiovascular status: blood pressure returned to baseline and stable Postop Assessment: no apparent nausea or vomiting Anesthetic complications: no   No notable events documented.   Last Vitals:  Vitals:   02/12/23 0948 02/12/23 0958  BP: 120/75 (!) 141/69  Pulse: 65 67  Resp: 19 16  Temp:    SpO2: 99% 96%    Last Pain:  Vitals:   02/13/23 0749  TempSrc:   PainSc: 0-No pain                 Lenard Simmer

## 2023-03-05 ENCOUNTER — Ambulatory Visit: Payer: Managed Care, Other (non HMO) | Admitting: Obstetrics & Gynecology

## 2023-03-05 ENCOUNTER — Encounter: Payer: Self-pay | Admitting: Obstetrics & Gynecology

## 2023-03-05 VITALS — BP 143/82 | HR 72 | Ht 64.0 in | Wt 179.8 lb

## 2023-03-05 DIAGNOSIS — N814 Uterovaginal prolapse, unspecified: Secondary | ICD-10-CM | POA: Diagnosis not present

## 2023-03-05 DIAGNOSIS — Z4689 Encounter for fitting and adjustment of other specified devices: Secondary | ICD-10-CM

## 2023-03-05 DIAGNOSIS — N8110 Cystocele, unspecified: Secondary | ICD-10-CM

## 2023-03-05 NOTE — Progress Notes (Signed)
     GYN VISIT Patient name: Dana Dickerson MRN [id number]  Date of birth: [birth year] Chief Complaint:    Chief Complaint  Patient presents with   Pessary Check   History of Present Illness:   Dana Dickerson is a 62 y.o. [id number] PM female being seen today for the following concerns:   Pessary maintenance- Initially, she was doing ok with the device; however now she feels like she is ready for surgical intervention; however, she lost the contact information for Dr. Florian Buff.  Pessary is only helping a little and still having incomplete voiding.  Feels like she is not able to "clean well inside" due to the device.  Not sexually active.  Notes slight discharge.  No itching, no odor.   -still having issues with emptying her bladder/incomplete voiding.     Likert scale(1 not bothersome -5 very bothersome)  :  5 However, she would prefer to have the pessary as opposed to nothing.  Review of Systems:   Pertinent items are noted in HPI Denies fever/chills, dizziness, headaches, visual disturbances, fatigue, shortness of breath, chest pain, abdominal pain, vomiting. Pertinent History Reviewed:  Reviewed past medical,surgical, social, obstetrical and family history.  Reviewed problem list, medications and allergies. Physical Assessment:   Vitals:   03/05/23 1442  BP: (!) 143/82  Pulse: 72  Weight: 179 lb 12.8 oz (81.6 kg)  Height: 5\' 4"  (1.626 m)  Body mass index is 30.86 kg/m.       Physical Examination:   General appearance: alert, well appearing, and in no distress  Psych: mood appropriate, normal affect  Skin: warm & dry   Cardiovascular: normal heart rate noted  Respiratory: normal respiratory effort, no distress  Abdomen: soft, non-tender   GU: normal external genitalia.  Visible prolapse noted prior to removal of device.  Rings removed without difficulty.  Grade 3 cystocele noted.  Irrigation completed. Vagina: Exam reveals no undue vaginal mucosal pressure of breakdown,  no discharge and no vaginal bleeding.  Vaginal Epithelial Abnormality Classification System:   0 0    No abnormalities 1    Epithelial erythema 2    Granulation tissue 3    Epithelial break or erosion, 1 cm or less 4    Epithelial break or erosion, 1 cm or greater  Pessary replaced without difficulty.  Extremities: no edema   Chaperone: Faith Rogue    Assessment & Plan:     ICD-10-CM   1. POP-Q stage 3 cystocele  N81.10 Ambulatory referral to Urogynecology    CANCELED: Ambulatory referral to Urogynecology    2. Uterine prolapse  N81.4 Ambulatory referral to Urogynecology    CANCELED: Ambulatory referral to Urogynecology    3. Pessary maintenance  Z46.89       -continue with pessary for now, []  may consider altering pessary if surgical intervention is not scheduled within the next 3-35mos -follow up referral created  Orders Placed This Encounter  Procedures   Ambulatory referral to Urogynecology    Return for 3-4 mos pessary maintenance.   Myna Hidalgo, DO Attending Obstetrician & Gynecologist, [facility] [place] [facility] for [employer], [facility]

## 2023-03-07 ENCOUNTER — Encounter: Payer: Self-pay | Admitting: *Deleted

## 2023-03-08 ENCOUNTER — Other Ambulatory Visit: Payer: Self-pay | Admitting: *Deleted

## 2023-03-08 DIAGNOSIS — Z122 Encounter for screening for malignant neoplasm of respiratory organs: Secondary | ICD-10-CM

## 2023-03-08 DIAGNOSIS — Z87891 Personal history of nicotine dependence: Secondary | ICD-10-CM

## 2023-04-03 ENCOUNTER — Encounter: Payer: Self-pay | Admitting: Acute Care

## 2023-04-24 ENCOUNTER — Other Ambulatory Visit: Payer: Self-pay | Admitting: Obstetrics & Gynecology

## 2023-04-24 ENCOUNTER — Other Ambulatory Visit (HOSPITAL_COMMUNITY): Payer: Self-pay | Admitting: Obstetrics & Gynecology

## 2023-04-24 ENCOUNTER — Ambulatory Visit (INDEPENDENT_AMBULATORY_CARE_PROVIDER_SITE_OTHER): Payer: Managed Care, Other (non HMO) | Admitting: Acute Care

## 2023-04-24 ENCOUNTER — Encounter: Payer: Self-pay | Admitting: Acute Care

## 2023-04-24 ENCOUNTER — Ambulatory Visit
Admission: RE | Admit: 2023-04-24 | Discharge: 2023-04-24 | Disposition: A | Discharge status: Home / Self Care | Payer: Managed Care, Other (non HMO) | Source: Ambulatory Visit | Attending: Acute Care | Admitting: Acute Care

## 2023-04-24 DIAGNOSIS — Z87891 Personal history of nicotine dependence: Secondary | ICD-10-CM

## 2023-04-24 DIAGNOSIS — F17211 Nicotine dependence, cigarettes, in remission: Secondary | ICD-10-CM

## 2023-04-24 DIAGNOSIS — N8110 Cystocele, unspecified: Secondary | ICD-10-CM

## 2023-04-24 DIAGNOSIS — N814 Uterovaginal prolapse, unspecified: Secondary | ICD-10-CM

## 2023-04-24 DIAGNOSIS — Z1231 Encounter for screening mammogram for malignant neoplasm of breast: Secondary | ICD-10-CM

## 2023-04-24 DIAGNOSIS — Z122 Encounter for screening for malignant neoplasm of respiratory organs: Secondary | ICD-10-CM

## 2023-04-24 NOTE — Progress Notes (Signed)
Virtual Visit via Telephone Note  I connected with Dana Dickerson on 04/24/23 at  8:15 AM EDT by telephone and verified that I am speaking with the correct person using two identifiers.  Location: Patient: At home Provider: [address] [place], [place], [address]    I discussed the limitations, risks, security and privacy concerns of performing an evaluation and management service by telephone and the availability of in person appointments. I also discussed with the patient that there may be a patient responsible charge related to this service. The patient expressed understanding and agreed to proceed.    Shared Decision Making Visit Lung Cancer Screening Program [phone])   Eligibility: Age 62 y.o. Pack Years Smoking History Calculation 30 pack years (# packs/per year x # years smoked) Recent History of coughing up blood  no Unexplained weight loss? no ( >Than 15 pounds within the last 6 months ) Prior History Lung / other cancer no (Diagnosis within the last 5 years already requiring surveillance chest CT Scans). Smoking Status Former Smoker Former Smokers: Years since quit: 7 years  Quit Date: 2017  Visit Components: Discussion included one or more decision making aids. yes Discussion included risk/benefits of screening. yes Discussion included potential follow up diagnostic testing for abnormal scans. yes Discussion included meaning and risk of over diagnosis. yes Discussion included meaning and risk of False Positives. yes Discussion included meaning of total radiation exposure. yes  Counseling Included: Importance of adherence to annual lung cancer LDCT screening. yes Impact of comorbidities on ability to participate in the program. yes Ability and willingness to under diagnostic treatment. yes  Smoking Cessation Counseling: Current Smokers:  Discussed importance of smoking cessation. yes Information about tobacco cessation classes and interventions provided  to patient. yes Patient provided with "ticket" for LDCT Scan. yes Symptomatic Patient. no  Counseling NA Diagnosis Code: Tobacco Use Z72.0 Asymptomatic Patient yes  Counseling (Intermediate counseling: > three minutes counseling) [id number] Former Smokers:  Discussed the importance of maintaining cigarette abstinence. yes Diagnosis Code: Personal History of Nicotine Dependence. B28.413 Information about tobacco cessation classes and interventions provided to patient. Yes Patient provided with "ticket" for LDCT Scan. yes Written Order for Lung Cancer Screening with LDCT placed in Epic. Yes (CT Chest Lung Cancer Screening Low Dose W/O CM) [id number] Z12.2-Screening of respiratory organs Z87.891-Personal history of nicotine dependence   Bevelyn Ngo, NP 04/24/2023

## 2023-04-24 NOTE — Patient Instructions (Signed)

## 2023-05-07 ENCOUNTER — Encounter (HOSPITAL_COMMUNITY): Payer: Self-pay

## 2023-05-07 ENCOUNTER — Ambulatory Visit (HOSPITAL_COMMUNITY)
Admission: RE | Admit: 2023-05-07 | Discharge: 2023-05-07 | Disposition: A | Discharge status: Home / Self Care | Payer: Managed Care, Other (non HMO) | Source: Ambulatory Visit | Attending: Obstetrics & Gynecology | Admitting: Obstetrics & Gynecology

## 2023-05-07 DIAGNOSIS — Z1231 Encounter for screening mammogram for malignant neoplasm of breast: Secondary | ICD-10-CM | POA: Diagnosis present

## 2023-06-05 ENCOUNTER — Ambulatory Visit: Payer: Managed Care, Other (non HMO) | Admitting: Obstetrics & Gynecology

## 2023-06-12 ENCOUNTER — Encounter: Payer: Self-pay | Admitting: Adult Health

## 2023-06-12 ENCOUNTER — Ambulatory Visit: Payer: Managed Care, Other (non HMO) | Admitting: Adult Health

## 2023-06-12 VITALS — BP 128/82 | HR 74 | Ht 65.0 in | Wt 164.4 lb

## 2023-06-12 DIAGNOSIS — N898 Other specified noninflammatory disorders of vagina: Secondary | ICD-10-CM | POA: Diagnosis not present

## 2023-06-12 DIAGNOSIS — N814 Uterovaginal prolapse, unspecified: Secondary | ICD-10-CM | POA: Insufficient documentation

## 2023-06-12 DIAGNOSIS — Z4689 Encounter for fitting and adjustment of other specified devices: Secondary | ICD-10-CM | POA: Diagnosis not present

## 2023-06-12 DIAGNOSIS — N8110 Cystocele, unspecified: Secondary | ICD-10-CM | POA: Insufficient documentation

## 2023-06-12 MED ORDER — METRONIDAZOLE 0.75 % VA GEL: 1.0000 | Freq: Every day | VAGINAL | 1 refills | Status: DC

## 2023-06-12 NOTE — Progress Notes (Signed)
  Subjective:     Patient ID: Dana Dickerson, female   DOB: 09-Oct-1960, 62 y.o.   MRN: [id number]  HPI [name] is a 62 year old white female, married, PM in for pessary maintenance.  Has vaginal discharge.  She has appt. January 21,25 to discuss surgery with Dr Florian Buff     Component Value Date/Time   DIAGPAP  09/18/2022 1214    - Negative for intraepithelial lesion or malignancy (NILM)   DIAGPAP  05/04/2020 1016    - Negative for intraepithelial lesion or malignancy (NILM)   DIAGPAP  12/18/2017 0000    NEGATIVE FOR INTRAEPITHELIAL LESIONS OR MALIGNANCY.   HPVHIGH Negative 09/18/2022 1214   HPVHIGH Negative 05/04/2020 1016   ADEQPAP  09/18/2022 1214    Satisfactory for evaluation; transformation zone component PRESENT.   ADEQPAP  05/04/2020 1016    Satisfactory for evaluation; transformation zone component PRESENT.   ADEQPAP  12/18/2017 0000    Satisfactory for evaluation  endocervical/transformation zone component PRESENT.   PCP is Dr Graciela Husbands   Review of Systems For pessary maintenance +vaginal discharge Denies any bleeding    Reviewed past medical,surgical, social and family history. Reviewed medications and allergies.  Objective:   Physical Exam BP 128/82 (BP Location: Right Arm, Patient Position: Sitting, Cuff Size: Normal)   Pulse 74   Ht 5\' 5"  (1.651 m)   Wt 164 lb 6.4 oz (74.6 kg)   BMI 27.36 kg/m     Skin warm and dry.Pelvic: external genitalia is normal in appearance no lesions, vagina: pessary removed, has creamy discharge, washed with soap and water and dried, no vaginal irritation,+cystocele and prolapse, pessary easily reinserted,urethra has no lesions or masses noted, cervix:smooth and bulbous, uterus: normal size, shape and contour, non tender, no masses felt, adnexa: no masses or tenderness noted. Bladder is non tender and no masses felt. Fall risk is low  Upstream - 06/12/23 1535       Pregnancy Intention Screening   Does the patient want to become pregnant  in the next year? N/A    Does the patient's partner want to become pregnant in the next year? N/A    Would the patient like to discuss contraceptive options today? N/A      Contraception Wrap Up   Current Method Female Sterilization   PM   End Method Female Sterilization   PM   Contraception Counseling Provided No            Examination chaperoned by Freddie Apley RN  Assessment:     1. Pessary maintenance Cleaned and reinserted   2. POP-Q stage 3 cystocele  3. Uterine prolapse  4. Vaginal discharge +white discharge Will refill metrogel Meds ordered this encounter  Medications   metroNIDAZOLE (METROGEL) 0.75 % vaginal gel    Sig: Place 1 Applicatorful vaginally at bedtime.    Dispense:  70 g    Refill:  1    Supervising Provider:   Lazaro Arms [[id number]]       Plan:     Follow up prn

## 2023-06-14 ENCOUNTER — Telehealth: Payer: Self-pay | Admitting: Acute Care

## 2023-06-14 ENCOUNTER — Other Ambulatory Visit: Payer: Self-pay

## 2023-06-14 DIAGNOSIS — Z122 Encounter for screening for malignant neoplasm of respiratory organs: Secondary | ICD-10-CM

## 2023-06-14 DIAGNOSIS — Z87891 Personal history of nicotine dependence: Secondary | ICD-10-CM

## 2023-07-17 ENCOUNTER — Encounter: Payer: Self-pay | Admitting: Obstetrics and Gynecology

## 2023-07-17 ENCOUNTER — Ambulatory Visit: Payer: Managed Care, Other (non HMO) | Admitting: Obstetrics and Gynecology

## 2023-07-17 VITALS — BP 153/93 | HR 71

## 2023-07-17 DIAGNOSIS — N3281 Overactive bladder: Secondary | ICD-10-CM

## 2023-07-17 DIAGNOSIS — N812 Incomplete uterovaginal prolapse: Secondary | ICD-10-CM

## 2023-07-17 DIAGNOSIS — N393 Stress incontinence (female) (male): Secondary | ICD-10-CM | POA: Diagnosis not present

## 2023-07-17 NOTE — Patient Instructions (Signed)

## 2023-07-17 NOTE — Progress Notes (Signed)
[facility] Return Visit  SUBJECTIVE  History of Present Illness: Dana Dickerson is a 63 y.o. female seen in follow-up for prolapse. After last visit, she continued with her pessary and was seeing [facility] for pessary cleanings. She did have the pessary fall out and she was given a larger size. She feels the new ring pessary is dropping down despite larger size. She is also having trouble emptying her bladder.   If she tries to hold her bladder, then she has to run to the bathroom and has some leakage. Has occasional leakage with cough with a full bladder.   She is on zepbound and has lost weight. Has occasional constipation. She is taking miralax and tries not to strain.    Past Medical History: Patient  has a past medical history of Anxiety, Arthritis, Hypertension, Hyperthyroidism, Pre-diabetes, Psoriasis, Serum calcium elevated (12/24/2017), Thyroid cyst, Uterine leiomyoma, Uterovaginal prolapse, and Varicose veins of both lower extremities.   Past Surgical History: She  has a past surgical history that includes Dilation and curettage of uterus; Colonoscopy with propofol (N/A, 12/15/2016); laparoscopic leiomyoma removed from presacral space (09/19/2017); Tubal ligation; RECTAL EUS; Diagnostic laparoscopy; Sacrococcygeal ulcer removal; Colonoscopy with propofol (N/A, 02/12/2023); and polypectomy (02/12/2023).   Medications: She has a current medication list which includes the following prescription(s): acetaminophen, prevagen, vitamin c, breztri aerosphere, bupropion, clobetasol propionate, elderberry, ibuprofen, lisinopril-hydrochlorothiazide, melatonin, methimazole, metronidazole, multivitamin, omeprazole, zepbound, triamcinolone ointment, and zinc gluconate.   Allergies: Patient is allergic to codeine, amoxicillin, ampicillin, and penicillins.   Social History: Patient  reports that she quit smoking about 10 years ago. Her smoking use included cigarettes. She started  smoking about 50 years ago. She has a 60 pack-year smoking history. She has never used smokeless tobacco. She reports current alcohol use. She reports that she does not use drugs.     OBJECTIVE     Physical Exam: Vitals:   07/17/23 1248  BP: (!) 153/93  Pulse: 71   Gen: No apparent distress, A&O x 3.  Detailed Urogynecologic Evaluation:  Normal external genitalia. On speculum, normal vaginal mucosa and normal appearing cervix. On bimanual, uterus is small, mobile and nontender.   POP-Q  0                                            Aa   0                                           Ba  -5.5                                              C   4                                            Gh  2.5  Pb  8                                            tvl   1                                            Ap  1                                            Bp  -7                                              D      ASSESSMENT AND PLAN    Dana Dickerson is a 63 y.o. with:  1. Uterovaginal prolapse, incomplete   2. SUI (stress urinary incontinence, female)   3. Overactive bladder    - She would like to proceed with surgery for her prolapse.  - We discussed two options for prolapse repair:  1) vaginal repair without mesh - Pros - safer, no mesh complications - Cons - not as strong as mesh repair, higher risk of recurrence  2) laparoscopic repair with mesh - Pros - stronger, better long-term success - Cons - risks of mesh implant (erosion into vagina or bladder, adhering to the rectum, pain) - these risks are lower than with a vaginal mesh but still exist - [facility] handouts provided on surgery options.  - Will have her undergo urodynamic testing to better differentiate her leakage symptoms. We discussed that if UDS demonstrates SUI then can plan for concurrent procedure with her prolapse repair.     Marguerita Beards, MD

## 2023-08-14 ENCOUNTER — Ambulatory Visit (INDEPENDENT_AMBULATORY_CARE_PROVIDER_SITE_OTHER): Payer: Managed Care, Other (non HMO) | Admitting: Obstetrics and Gynecology

## 2023-08-14 ENCOUNTER — Encounter: Payer: Self-pay | Admitting: Obstetrics and Gynecology

## 2023-08-14 ENCOUNTER — Encounter: Payer: Managed Care, Other (non HMO) | Admitting: Obstetrics and Gynecology

## 2023-08-14 VITALS — BP 155/77 | HR 75

## 2023-08-14 DIAGNOSIS — R350 Frequency of micturition: Secondary | ICD-10-CM

## 2023-08-14 DIAGNOSIS — N393 Stress incontinence (female) (male): Secondary | ICD-10-CM

## 2023-08-14 DIAGNOSIS — N3281 Overactive bladder: Secondary | ICD-10-CM | POA: Diagnosis not present

## 2023-08-14 LAB — POCT URINALYSIS DIPSTICK
Bilirubin, UA: NEGATIVE
Blood, UA: NEGATIVE
Glucose, UA: NEGATIVE
Ketones, UA: NEGATIVE
Leukocytes, UA: NEGATIVE
Nitrite, UA: NEGATIVE
Protein, UA: NEGATIVE
Spec Grav, UA: 1.01 (ref 1.010–1.025)
Urobilinogen, UA: 0.2 U/dL
pH, UA: 7 (ref 5.0–8.0)

## 2023-08-14 NOTE — Progress Notes (Signed)
[facility] Urodynamics Procedure  Referring Physician: Lynnea Ferrier, MD Date of Procedure: 08/14/2023  Dana Dickerson is a 63 y.o. female who presents for urodynamic evaluation. Indication(s) for study: mixed incontinence  Vital Signs: BP (!) 148/80   Pulse 75   Laboratory Results: A catheterized urine specimen revealed:  POC urine:  Lab Results  Component Value Date   COLORU Yellow 02/21/2022   CLARITYU Clear 02/21/2022   GLUCOSEUR Negative 06/13/2022   BILIRUBINUR NEGATIVE 04/21/2022   KETONESU neg 06/13/2022   SPECGRAV <=1.005 (A) 02/21/2022   RBCUR neg 06/13/2022   PHUR 5.5 02/21/2022   PROTEINUR Negative 06/13/2022   UROBILINOGEN 0.2 02/21/2022   LEUKOCYTESUR Small (1+) (A) 06/13/2022     Voiding Diary: Deferred  Procedure Timeout:  The correct patient was verified and the correct procedure was verified. The patient was in the correct position and safety precautions were reviewed based on at the patient's history.  Urodynamic Procedure A 18F dual lumen urodynamics catheter was placed under sterile conditions into the patient's bladder. A 18F catheter was placed into the rectum in order to measure abdominal pressure. EMG patches were placed in the appropriate position.  All connections were confirmed and calibrations/adjusted made. Saline was instilled into the bladder through the dual lumen catheters.  Cough/valsalva pressures were measured periodically during filling.  Patient was allowed to void.  The bladder was then emptied of its residual.  UROFLOW: Uroflow did not capture.  Patient voided and had a residual of . She did have a pessary in place. This was removed for the rest of the procedure.   CMG: This was performed with sterile water in the sitting position at a fill rate of 30 mL/min.    First sensation of fullness was 135 mLs,  First urge was 229 mLs,  Strong urge was 273 mLs and  Capacity was 536 mLs  Stress incontinence  was demonstrated Highest positive Barrier CLPP was 110 cmH20 at 250 ml. Highest negative Barrier VLPP was 54 cmH20 at 250 ml.  Detrusor function was overactive, with phasic contractions seen.  The first occurred at 132 mL to 3 cm of water and was not associated with urge.  Compliance:  Normal. End fill detrusor pressure was 10.3cmH20.  Calculated compliance was 64mL/cmH20  UPP: MUCP with barrier reduction was 75 cm of water.    MICTURITION STUDY: Voiding was performed with reduction using scopettes in the sitting position.  Pdet at Qmax was 21.5 cm of water.  Qmax was 28 mL/sec.  It was a normal pattern.  She voided 446 mL and had a residual of 90 mL.  It was a volitional void, sustained detrusor contraction was present and abdominal straining was not present  EMG: This was performed with patches.  She had voluntary contractions, recruitment with fill was not present and urethral sphincter was not relaxed with void.  The details of the procedure with the study tracings have been scanned into EPIC.   Urodynamic Impression:  1. Sensation was normal; capacity was normal 2. Stress Incontinence was demonstrated at normal pressures; 3. Detrusor Overactivity was demonstrated without leakage. 4. Emptying was normal with a normal PVR, a sustained detrusor contraction present,  abdominal straining not present, dyssynergic urethral sphincter activity on EMG.  Plan: - The patient will follow up  to discuss the findings and treatment options.

## 2023-08-27 NOTE — Progress Notes (Unsigned)
 [facility] Return Visit- video visit Patient location- home, [place] Provider location- office Pt was the only person on the call  SUBJECTIVE  History of Present Illness: Dana Dickerson is a 64 y.o. female seen in follow-up for prolapse. She underwent urodynamic testing.   Urodynamic Impression:  1. Sensation was normal; capacity was normal 2. Stress Incontinence was demonstrated at normal pressures; 3. Detrusor Overactivity was demonstrated without leakage. 4. Emptying was normal with a normal PVR, a sustained detrusor contraction present,  abdominal straining not present, dyssynergic urethral sphincter activity on EMG.  Past Medical History: Patient  has a past medical history of Anxiety, Arthritis, Hypertension, Hyperthyroidism, Pre-diabetes, Psoriasis, Serum calcium elevated (12/24/2017), Thyroid cyst, Uterine leiomyoma, Uterovaginal prolapse, and Varicose veins of both lower extremities.   Past Surgical History: She  has a past surgical history that includes Dilation and curettage of uterus; Colonoscopy with propofol (N/A, 12/15/2016); laparoscopic leiomyoma removed from presacral space (09/19/2017); Tubal ligation; RECTAL EUS; Diagnostic laparoscopy; Sacrococcygeal ulcer removal; Colonoscopy with propofol (N/A, 02/12/2023); and polypectomy (02/12/2023).   Medications: She has a current medication list which includes the following prescription(s): acetaminophen, prevagen, vitamin c, breztri aerosphere, bupropion, clobetasol propionate, elderberry, ibuprofen, lisinopril-hydrochlorothiazide, melatonin, methimazole, metronidazole, multivitamin, omeprazole, zepbound, triamcinolone ointment, and zinc gluconate.   Allergies: Patient is allergic to codeine, amoxicillin, ampicillin, and penicillins.   Social History: Patient  reports that she quit smoking about 10 years ago. Her smoking use included cigarettes. She started smoking about 50 years ago. She has a 60 pack-year smoking  history. She has never used smokeless tobacco. She reports current alcohol use. She reports that she does not use drugs.     OBJECTIVE     Physical Exam: There were no vitals filed for this visit. Gen: No apparent distress, A&O x 3.  Detailed Urogynecologic Evaluation:  Deferred. Prior exam showed:  POP-Q   0                                            Aa   0                                           Ba   -5.5                                              C    4                                            Gh   2.5                                            Pb   8  tvl    1                                            Ap   1                                            Bp   -7                                              D           ASSESSMENT AND PLAN    Ms. Pinela is a 63 y.o. with:  1. Uterovaginal prolapse, incomplete   2. SUI (stress urinary incontinence, female)   3. Overactive bladder     - We reviewed the presences of SUI and OAB on urodynamic testing. She does not feel her OAB symptoms are bothersome enough for treatment. We discussed that she may need further medical  treatment after surgery if symptoms change, as the prolapse surgery will not improve this.   Plan for surgery: Exam under anesthesia, total vaginal hysterectomy, possible bilateral salpingo-oophorectomy, uterosacral suspension, anterior and posterior repair with perineorrhaphy, midurethral sling, cystoscopy  - We reviewed the patient's specific anatomic and functional findings, with the assistance of diagrams, and together finalized the above procedure. The planned surgical procedures were discussed along with the surgical risks outlined below, which were also provided on a detailed handout. Additional treatment options including expectant management, conservative management, medical management were discussed where appropriate.  We reviewed the benefits and  risks of each treatment option.   General Surgical Risks: For all procedures, there are risks of bleeding, infection, damage to surrounding organs including but not limited to bowel, bladder, blood vessels, ureters and nerves, and need for further surgery if an injury were to occur. These risks are all low with minimally invasive surgery.   There are risks of numbness and weakness at any body site or buttock/rectal pain.  It is possible that baseline pain can be worsened by surgery, either with or without mesh. If surgery is vaginal, there is also a low risk of possible conversion to laparoscopy or open abdominal incision where indicated. Very rare risks include blood transfusion, blood clot, heart attack, pneumonia, or death.   There is also a risk of short-term postoperative urinary retention with need to use a catheter. About half of patients need to go home from surgery with a catheter, which is then later removed in the office. The risk of long-term need for a catheter is very low. There is also a risk of worsening of overactive bladder.   Sling: The effectiveness of a midurethral vaginal mesh sling is approximately 85%, and thus, there will be times when you may leak urine after surgery, especially if your bladder is full or if you have a strong cough. There is a balance between making the sling tight enough to treat your leakage but not too tight so that you have long-term difficulty emptying your bladder. A mesh sling will not directly treat overactive bladder/urge incontinence and may worsen it.  There is an FDA safety notification on vaginal mesh procedures  for prolapse but NOT mesh slings. We have extensive experience and training with mesh placement and we have close postoperative follow up to identify any potential complications from mesh. It is important to realize that this mesh is a permanent implant that cannot be easily removed. There are rare risks of mesh exposure (2-4%), pain with  intercourse (0-7%), and infection (<1%). The risk of mesh exposure if more likely in a woman with risks for poor healing (prior radiation, poorly controlled diabetes, or immunocompromised). The risk of new or worsened chronic pain after mesh implant is more common in women with baseline chronic pain and/or poorly controlled anxiety or depression. Approximately 2-4% of patients will experience longer-term post-operative voiding dysfunction that may require surgical revision of the sling. We also reviewed that postoperatively, her stream may not be as strong as before surgery.   Prolapse (with or without mesh): Risk factors for surgical failure  include things that put pressure on your pelvis and the surgical repair, including obesity, chronic cough, and heavy lifting or straining (including lifting children or adults, straining on the toilet, or lifting heavy objects such as furniture or anything weighing >25 lbs. Risks of recurrence is 20-30% with vaginal native tissue repair and a less than 10% with sacrocolpopexy with mesh.     - For preop Visit:  She is required to have a visit within 30 days of her surgery.   - Medical clearance: not required  - Anticoagulant use: No - Medicaid Hysterectomy form: No - Accepts blood transfusion: Yes - Expected length of stay: outpatient  Request sent for surgery scheduling.   Marguerita Beards, MD

## 2023-08-28 ENCOUNTER — Telehealth (INDEPENDENT_AMBULATORY_CARE_PROVIDER_SITE_OTHER): Payer: Managed Care, Other (non HMO) | Admitting: Obstetrics and Gynecology

## 2023-08-28 DIAGNOSIS — N393 Stress incontinence (female) (male): Secondary | ICD-10-CM

## 2023-08-28 DIAGNOSIS — N3281 Overactive bladder: Secondary | ICD-10-CM

## 2023-08-28 DIAGNOSIS — N812 Incomplete uterovaginal prolapse: Secondary | ICD-10-CM

## 2023-08-28 NOTE — Patient Instructions (Addendum)
 Exam under anesthesia, total vaginal hysterectomy (removal of uterus), possible bilateral salpingo-oophorectomy (removal of tubes and ovaries), uterosacral suspension, anterior and posterior repair with perineorrhaphy, midurethral sling, cystoscopy

## 2023-11-13 ENCOUNTER — Encounter: Payer: Self-pay | Admitting: *Deleted

## 2024-01-01 ENCOUNTER — Other Ambulatory Visit: Payer: Self-pay | Admitting: Obstetrics and Gynecology

## 2024-01-01 ENCOUNTER — Ambulatory Visit (INDEPENDENT_AMBULATORY_CARE_PROVIDER_SITE_OTHER): Admitting: Obstetrics and Gynecology

## 2024-01-01 VITALS — BP 156/95 | HR 69

## 2024-01-01 DIAGNOSIS — N8110 Cystocele, unspecified: Secondary | ICD-10-CM

## 2024-01-01 DIAGNOSIS — Z01818 Encounter for other preprocedural examination: Secondary | ICD-10-CM

## 2024-01-01 DIAGNOSIS — N812 Incomplete uterovaginal prolapse: Secondary | ICD-10-CM

## 2024-01-01 DIAGNOSIS — N393 Stress incontinence (female) (male): Secondary | ICD-10-CM

## 2024-01-01 DIAGNOSIS — N3281 Overactive bladder: Secondary | ICD-10-CM

## 2024-01-01 DIAGNOSIS — R350 Frequency of micturition: Secondary | ICD-10-CM

## 2024-01-01 MED ORDER — IBUPROFEN 600 MG PO TABS: 600.0000 mg | ORAL_TABLET | Freq: Four times a day (QID) | ORAL | 0 refills | Status: AC | PRN

## 2024-01-01 MED ORDER — POLYETHYLENE GLYCOL 3350 17 GM/SCOOP PO POWD: 17.0000 g | Freq: Every day | ORAL | 0 refills | Status: AC

## 2024-01-01 MED ORDER — GABAPENTIN 100 MG PO CAPS: 100.0000 mg | ORAL_CAPSULE | Freq: Three times a day (TID) | ORAL | 0 refills | Status: DC

## 2024-01-01 MED ORDER — ACETAMINOPHEN 500 MG PO TABS: 500.0000 mg | ORAL_TABLET | Freq: Four times a day (QID) | ORAL | 0 refills | Status: AC | PRN

## 2024-01-01 MED ORDER — ESTRADIOL 0.1 MG/GM VA CREA: 0.5000 g | TOPICAL_CREAM | VAGINAL | 11 refills | Status: AC

## 2024-01-01 NOTE — Progress Notes (Signed)
 [facility] Pre-Operative Exam  Subjective Chief Complaint: Dana Dickerson presents for a preoperative encounter.   History of Present Illness: Dana Dickerson is a 63 y.o. female who presents for preoperative visit.  She is scheduled to undergo  Exam under anesthesia, total vaginal hysterectomy, possible bilateral salpingo-oophorectomy, uterosacral suspension, anterior and posterior repair with perineorrhaphy, midurethral sling, cystoscopy  on 01/15/24.  Her symptoms include pelvic organ prolapse and stress urinary incontinence, and she was was found to have Stage III anterior, Stage I posterior, Stage I apical prolapse.   Urodynamics showed: 1. Sensation was normal; capacity was normal 2. Stress Incontinence was demonstrated at normal pressures; 3. Detrusor Overactivity was demonstrated without leakage. 4. Emptying was normal with a normal PVR, a sustained detrusor contraction present,  abdominal straining not present, dyssynergic urethral sphincter activity on EMG.  Past Medical History:  Diagnosis Date   Anxiety    Arthritis    Hypertension    Hyperthyroidism    Pre-diabetes    Psoriasis    Serum calcium elevated 12/24/2017   Refer to Dr Lenis   Thyroid  cyst    Uterine leiomyoma    Uterovaginal prolapse    Varicose veins of both lower extremities      Past Surgical History:  Procedure Laterality Date   COLONOSCOPY WITH PROPOFOL  N/A 12/15/2016   Procedure: COLONOSCOPY WITH PROPOFOL ;  Surgeon: Therisa Bi, MD;  Location: [facility] ENDOSCOPY;  Service: Endoscopy;  Laterality: N/A;   COLONOSCOPY WITH PROPOFOL  N/A 02/12/2023   Procedure: COLONOSCOPY WITH PROPOFOL ;  Surgeon: Onita Elspeth Sharper, DO;  Location: [facility] ENDOSCOPY;  Service: Gastroenterology;  Laterality: N/A;   DIAGNOSTIC LAPAROSCOPY     DILATION AND CURETTAGE OF UTERUS     laparoscopic leiomyoma removed from presacral space  09/19/2017   POLYPECTOMY  02/12/2023   Procedure: POLYPECTOMY;  Surgeon: Onita Elspeth Sharper, DO;  Location: [facility] ENDOSCOPY;  Service: Gastroenterology;;   RECTAL EUS     SACROCOCCYGEAL ULCER REMOVAL     TUBAL LIGATION      is allergic to codeine, amoxicillin, ampicillin, and penicillins.   Family History  Problem Relation Age of Onset   Heart attack Paternal Grandfather    Mental illness Maternal Grandmother    Aneurysm Maternal Grandfather    Heart failure Father    Dementia Mother    Mental illness Sister    Mental illness Sister    Hypertension Son    Hypertension Son    Stroke Other     Social History   Tobacco Use   Smoking status: Former    Current packs/day: 0.00    Average packs/day: 1.5 packs/day for 40.0 years (60.0 ttl pk-yrs)    Types: Cigarettes    Start date: 01/29/1973    Quit date: 01/24/2013    Years since quitting: 10.9   Smokeless tobacco: Never   Tobacco comments:    Per pt she quit 2014 -1-2 ppd varied hx -from SDMV 30 pack years -pt reports 60 pack yrs on average   Vaping Use   Vaping status: Never Used  Substance Use Topics   Alcohol use: Yes    Comment: rarely, once a year   Drug use: No     Review of Systems was negative for a full 10 system review except as noted in the History of Present Illness.   Current Outpatient Medications:    Acetaminophen  (TYLENOL  PO), Take by mouth., Disp: , Rfl:    acetaminophen  (TYLENOL ) 500 MG tablet, Take 1 tablet (500  mg total) by mouth every 6 (six) hours as needed (pain)., Disp: 30 tablet, Rfl: 0   Apoaequorin (PREVAGEN) 10 MG CAPS, Take by mouth as directed., Disp: , Rfl:    Ascorbic Acid (VITAMIN C) 100 MG tablet, Take 100 mg by mouth daily., Disp: , Rfl:    Budeson-Glycopyrrol-Formoterol (BREZTRI AEROSPHERE) 160-9-4.8 MCG/ACT AERO, Inhale 2 puffs into the lungs in the morning and at bedtime., Disp: , Rfl:    buPROPion  (WELLBUTRIN  XL) 150 MG 24 hr tablet, TAKE 1 TABLET BY MOUTH  DAILY, Disp: 90 tablet, Rfl: 3   Clobetasol Propionate (TEMOVATE) 0.05 % external spray, Apply  topically., Disp: , Rfl:    ELDERBERRY PO, Take by mouth daily., Disp: , Rfl:    [START ON 01/03/2024] estradiol  (ESTRACE ) 0.1 MG/GM vaginal cream, Place 0.5 g vaginally 2 (two) times a week. Place 0.5g nightly for two weeks then twice a week after, Disp: 42.5 g, Rfl: 11   gabapentin  (NEURONTIN ) 100 MG capsule, Take 1 capsule (100 mg total) by mouth 3 (three) times daily., Disp: 90 capsule, Rfl: 0   ibuprofen  (ADVIL ) 600 MG tablet, Take 1 tablet (600 mg total) by mouth every 6 (six) hours as needed., Disp: 30 tablet, Rfl: 0   ibuprofen  (ADVIL ,MOTRIN ) 200 MG tablet, Take 200 mg by mouth every 6 (six) hours as needed., Disp: , Rfl:    lisinopril -hydrochlorothiazide  (ZESTORETIC) 20-12.5 MG tablet, Take 1 tablet by mouth daily., Disp: , Rfl:    MELATONIN PO, Take by mouth daily., Disp: , Rfl:    methimazole (TAPAZOLE) 5 MG tablet, Take 5 mg by mouth 3 (three) times daily., Disp: , Rfl:    metroNIDAZOLE  (METROGEL ) 0.75 % vaginal gel, Place 1 Applicatorful vaginally at bedtime., Disp: 70 g, Rfl: 1   Multiple Vitamin (MULTIVITAMIN) tablet, Take 1 tablet by mouth daily., Disp: , Rfl:    omeprazole (PRILOSEC) 40 MG capsule, Take by mouth., Disp: , Rfl:    polyethylene glycol powder (GLYCOLAX /MIRALAX ) 17 GM/SCOOP powder, Take 17 g by mouth daily. Drink 17g (1 scoop) dissolved in water per day., Disp: 578 g, Rfl: 0   tirzepatide (ZEPBOUND) 2.5 MG/0.5ML Pen, Inject 2.5 mg into the skin once a week., Disp: , Rfl:    triamcinolone ointment (KENALOG) 0.1 %, Apply topically 2 (two) times daily., Disp: , Rfl:    zinc gluconate 50 MG tablet, Take 50 mg by mouth daily., Disp: , Rfl:    Objective Vitals:   01/01/24 0839  BP: (!) 156/95  Pulse: 69    Gen: NAD CV: S1 S2 RRR Lungs: Clear to auscultation bilaterally Abd: soft, nontender   Previous Pelvic Exam showed: Pelvic Exam: Normal external female genitalia; Bartholin's and Skene's glands normal in appearance; urethral meatus normal in appearance, no  urethral masses or discharge.    CST: negative Pessary removed and cleaned. It was replaced after the exam.  Speculum exam reveals normal vaginal mucosa with atrophy. Cervix normal appearance. Uterus normal single, nontender. Adnexa no mass, fullness, tenderness.       Pelvic floor strength II/V   Pelvic floor musculature: Right levator non-tender, Right obturator non-tender, Left levator non-tender, Left obturator non-tender   POP-Q:    POP-Q   2                                            Aa   2  Ba   -5                                              C    4                                            Gh   2.5                                            Pb   8                                            tvl    -3                                            Ap   -3                                            Bp   -6                                               Assessment/ Plan  Assessment: The patient is a 63 y.o. year old scheduled to undergo  Exam under anesthesia, total vaginal hysterectomy, possible bilateral salpingo-oophorectomy, uterosacral suspension, anterior and posterior repair with perineorrhaphy, midurethral sling, cystoscopy . Verbal consent was obtained for these procedures.  Plan: General Surgical Consent: The patient has previously been counseled on alternative treatments, and the decision by the patient and provider was to proceed with the procedure listed above.  For all procedures, there are risks of bleeding, infection, damage to surrounding organs including but not limited to bowel, bladder, blood vessels, ureters and nerves, and need for further surgery if an injury were to occur. These risks are all low with minimally invasive surgery.   There are risks of numbness and weakness at any body site or buttock/rectal pain.  It is possible that baseline pain can be worsened by surgery, either with or without mesh.  If surgery is vaginal, there is also a low risk of possible conversion to laparoscopy or open abdominal incision where indicated. Very rare risks include blood transfusion, blood clot, heart attack, pneumonia, or death.   There is also a risk of short-term postoperative urinary retention with need to use a catheter. About half of patients need to go home from surgery with a catheter, which is then later removed in the office. The risk of long-term need for a catheter is very low. There is also a risk of worsening of overactive bladder.   Sling: The effectiveness of a midurethral vaginal mesh sling is approximately 85%, and thus,  there will be times when you may leak urine after surgery, especially if your bladder is full or if you have a strong cough. There is a balance between making the sling tight enough to treat your leakage but not too tight so that you have long-term difficulty emptying your bladder. A mesh sling will not directly treat overactive bladder/urge incontinence and may worsen it.  There is an FDA safety notification on vaginal mesh procedures for prolapse but NOT mesh slings. We have extensive experience and training with mesh placement and we have close postoperative follow up to identify any potential complications from mesh. It is important to realize that this mesh is a permanent implant that cannot be easily removed. There are rare risks of mesh exposure (2-4%), pain with intercourse (0-7%), and infection (<1%). The risk of mesh exposure if more likely in a woman with risks for poor healing (prior radiation, poorly controlled diabetes, or immunocompromised). The risk of new or worsened chronic pain after mesh implant is more common in women with baseline chronic pain and/or poorly controlled anxiety or depression. Approximately 2-4% of patients will experience longer-term post-operative voiding dysfunction that may require surgical revision of the sling. We also reviewed that  postoperatively, her stream may not be as strong as before surgery.   Prolapse (with or without mesh): Risk factors for surgical failure  include things that put pressure on your pelvis and the surgical repair, including obesity, chronic cough, and heavy lifting or straining (including lifting children or adults, straining on the toilet, or lifting heavy objects such as furniture or anything weighing >25 lbs. Risks of recurrence is 20-30% with vaginal native tissue repair and a less than 10% with sacrocolpopexy with mesh.      We discussed consent for blood products. Risks for blood transfusion include allergic reactions, other reactions that can affect different body organs and managed accordingly, transmission of infectious diseases such as HIV or Hepatitis. However, the blood is screened. Patient consents for blood products.  Pre-operative instructions:  She was instructed to not take Aspirin/NSAIDs x 7days prior to surgery.  Antibiotic prophylaxis was ordered as indicated.  Catheter use: Patient will go home with foley if needed after post-operative voiding trial.  Post-operative instructions:  She was provided with specific post-operative instructions, including precautions and signs/symptoms for which we would recommend contacting us , in addition to daytime and after-hours contact phone numbers. This was provided on a handout.   Post-operative medications: Prescriptions for motrin , tylenol , miralax , and Gabapentin  were sent to her pharmacy. Discussed using ibuprofen  and tylenol  on a schedule to limit use of narcotics.   Laboratory testing:  We will check labs: CBC and Type and Screen  Preoperative clearance:  She does not require surgical clearance.    Post-operative follow-up:  A post-operative appointment will be made for 6 weeks from the date of surgery. If she needs a post-operative nurse visit for a voiding trial, that will be set up after she leaves the hospital.    Patient will call  the clinic or use MyChart should anything change or any new issues arise.   Dana Yuan G Aryav Wimberly, NP

## 2024-01-01 NOTE — H&P (Signed)
 [facility] H&P Subjective Chief Complaint: Dana Dickerson presents for a preoperative encounter.   History of Present Illness: Dana Dickerson is a 63 y.o. female who presents for preoperative visit.  She is scheduled to undergo  Exam under anesthesia, total vaginal hysterectomy, possible bilateral salpingo-oophorectomy, uterosacral suspension, anterior and posterior repair with perineorrhaphy, midurethral sling, cystoscopy  on 01/15/24.  Her symptoms include pelvic organ prolapse and stress urinary incontinence, and she was was found to have Stage III anterior, Stage I posterior, Stage I apical prolapse.   Urodynamics showed: 1. Sensation was normal; capacity was normal 2. Stress Incontinence was demonstrated at normal pressures; 3. Detrusor Overactivity was demonstrated without leakage. 4. Emptying was normal with a normal PVR, a sustained detrusor contraction present,  abdominal straining not present, dyssynergic urethral sphincter activity on EMG.  Past Medical History:  Diagnosis Date   Anxiety    Arthritis    Hypertension    Hyperthyroidism    Pre-diabetes    Psoriasis    Serum calcium elevated 12/24/2017   Refer to Dr Lenis   Thyroid  cyst    Uterine leiomyoma    Uterovaginal prolapse    Varicose veins of both lower extremities      Past Surgical History:  Procedure Laterality Date   COLONOSCOPY WITH PROPOFOL  N/A 12/15/2016   Procedure: COLONOSCOPY WITH PROPOFOL ;  Surgeon: Therisa Bi, MD;  Location: [facility] ENDOSCOPY;  Service: Endoscopy;  Laterality: N/A;   COLONOSCOPY WITH PROPOFOL  N/A 02/12/2023   Procedure: COLONOSCOPY WITH PROPOFOL ;  Surgeon: Onita Elspeth Sharper, DO;  Location: [facility] ENDOSCOPY;  Service: Gastroenterology;  Laterality: N/A;   DIAGNOSTIC LAPAROSCOPY     DILATION AND CURETTAGE OF UTERUS     laparoscopic leiomyoma removed from presacral space  09/19/2017   POLYPECTOMY  02/12/2023   Procedure: POLYPECTOMY;  Surgeon: Onita Elspeth Sharper, DO;   Location: [facility] ENDOSCOPY;  Service: Gastroenterology;;   RECTAL EUS     SACROCOCCYGEAL ULCER REMOVAL     TUBAL LIGATION      is allergic to codeine, amoxicillin, ampicillin, and penicillins.   Family History  Problem Relation Age of Onset   Heart attack Paternal Grandfather    Mental illness Maternal Grandmother    Aneurysm Maternal Grandfather    Heart failure Father    Dementia Mother    Mental illness Sister    Mental illness Sister    Hypertension Son    Hypertension Son    Stroke Other     Social History   Tobacco Use   Smoking status: Former    Current packs/day: 0.00    Average packs/day: 1.5 packs/day for 40.0 years (60.0 ttl pk-yrs)    Types: Cigarettes    Start date: 01/29/1973    Quit date: 01/24/2013    Years since quitting: 10.9   Smokeless tobacco: Never   Tobacco comments:    Per pt she quit 2014 -1-2 ppd varied hx -from SDMV 30 pack years -pt reports 60 pack yrs on average   Vaping Use   Vaping status: Never Used  Substance Use Topics   Alcohol use: Yes    Comment: rarely, once a year   Drug use: No     Review of Systems was negative for a full 10 system review except as noted in the History of Present Illness.  No current facility-administered medications for this encounter.  Current Outpatient Medications:    Acetaminophen  (TYLENOL  PO), Take by mouth., Disp: , Rfl:    acetaminophen  (TYLENOL ) 500 MG  tablet, Take 1 tablet (500 mg total) by mouth every 6 (six) hours as needed (pain)., Disp: 30 tablet, Rfl: 0   Apoaequorin (PREVAGEN) 10 MG CAPS, Take by mouth as directed., Disp: , Rfl:    Ascorbic Acid (VITAMIN C) 100 MG tablet, Take 100 mg by mouth daily., Disp: , Rfl:    Budeson-Glycopyrrol-Formoterol (BREZTRI AEROSPHERE) 160-9-4.8 MCG/ACT AERO, Inhale 2 puffs into the lungs in the morning and at bedtime., Disp: , Rfl:    buPROPion  (WELLBUTRIN  XL) 150 MG 24 hr tablet, TAKE 1 TABLET BY MOUTH  DAILY, Disp: 90 tablet, Rfl: 3   Clobetasol Propionate  (TEMOVATE) 0.05 % external spray, Apply topically., Disp: , Rfl:    ELDERBERRY PO, Take by mouth daily., Disp: , Rfl:    [START ON 01/03/2024] estradiol  (ESTRACE ) 0.1 MG/GM vaginal cream, Place 0.5 g vaginally 2 (two) times a week. Place 0.5g nightly for two weeks then twice a week after, Disp: 42.5 g, Rfl: 11   gabapentin  (NEURONTIN ) 100 MG capsule, Take 1 capsule (100 mg total) by mouth 3 (three) times daily., Disp: 90 capsule, Rfl: 0   ibuprofen  (ADVIL ) 600 MG tablet, Take 1 tablet (600 mg total) by mouth every 6 (six) hours as needed., Disp: 30 tablet, Rfl: 0   ibuprofen  (ADVIL ,MOTRIN ) 200 MG tablet, Take 200 mg by mouth every 6 (six) hours as needed., Disp: , Rfl:    lisinopril -hydrochlorothiazide  (ZESTORETIC) 20-12.5 MG tablet, Take 1 tablet by mouth daily., Disp: , Rfl:    MELATONIN PO, Take by mouth daily., Disp: , Rfl:    methimazole (TAPAZOLE) 5 MG tablet, Take 5 mg by mouth 3 (three) times daily., Disp: , Rfl:    metroNIDAZOLE  (METROGEL ) 0.75 % vaginal gel, Place 1 Applicatorful vaginally at bedtime., Disp: 70 g, Rfl: 1   Multiple Vitamin (MULTIVITAMIN) tablet, Take 1 tablet by mouth daily., Disp: , Rfl:    omeprazole (PRILOSEC) 40 MG capsule, Take by mouth., Disp: , Rfl:    polyethylene glycol powder (GLYCOLAX /MIRALAX ) 17 GM/SCOOP powder, Take 17 g by mouth daily. Drink 17g (1 scoop) dissolved in water per day., Disp: 578 g, Rfl: 0   tirzepatide (ZEPBOUND) 2.5 MG/0.5ML Pen, Inject 2.5 mg into the skin once a week., Disp: , Rfl:    triamcinolone ointment (KENALOG) 0.1 %, Apply topically 2 (two) times daily., Disp: , Rfl:    zinc gluconate 50 MG tablet, Take 50 mg by mouth daily., Disp: , Rfl:    Objective There were no vitals filed for this visit.   Gen: NAD CV: S1 S2 RRR Lungs: Clear to auscultation bilaterally Abd: soft, nontender   Previous Pelvic Exam showed: Pelvic Exam: Normal external female genitalia; Bartholin's and Skene's glands normal in appearance; urethral meatus  normal in appearance, no urethral masses or discharge.    CST: negative Pessary removed and cleaned. It was replaced after the exam.  Speculum exam reveals normal vaginal mucosa with atrophy. Cervix normal appearance. Uterus normal single, nontender. Adnexa no mass, fullness, tenderness.       Pelvic floor strength II/V   Pelvic floor musculature: Right levator non-tender, Right obturator non-tender, Left levator non-tender, Left obturator non-tender   POP-Q:    POP-Q   2                                            Aa   2  Ba   -5                                              C    4                                            Gh   2.5                                            Pb   8                                            tvl    -3                                            Ap   -3                                            Bp   -6                                               Assessment/ Plan  Assessment: The patient is a 63 y.o. year old scheduled to undergo  Exam under anesthesia, total vaginal hysterectomy, possible bilateral salpingo-oophorectomy, uterosacral suspension, anterior and posterior repair with perineorrhaphy, midurethral sling, cystoscopy . Verbal consent was obtained for these procedures.

## 2024-01-07 ENCOUNTER — Encounter (HOSPITAL_COMMUNITY): Payer: Self-pay | Admitting: Obstetrics and Gynecology

## 2024-01-09 ENCOUNTER — Encounter (HOSPITAL_COMMUNITY): Payer: Self-pay | Admitting: Obstetrics and Gynecology

## 2024-01-09 NOTE — Pre-Procedure Instructions (Signed)
 Surgical Instructions   Your procedure is scheduled on :  Tuesday,  01-15-2024. Report to [facility] Main Entrance A at 9:30  A.M., then check in with the Admitting office. Any questions or running late day of surgery: call [phone]  Questions prior to your surgery date: call [phone], Monday-Friday, 8am-4pm. If you experience any cold or flu symptoms such as cough, fever, chills, shortness of breath, etc. between now and your scheduled surgery, please notify your surgeon office.    Remember:  Do not eat any food after midnight the night before your surgery.  You may have clear liquids from midnight night before surgery until 8:30 AM.   Clear liquids allowed are:  Water Carbonated Beverages Clear Tea (may sweeten, no milk, honey, etc.) Black Coffee Only (may sweeten, NO MILK, CREAM OR POWDERED CREAMER of any kind) Sport drinks, like Gatorade.  NO clear liquids after 8:30 AM day of surgery.  This includes no water,  candy,  gum,  and  mints.    Take these medicines the morning of surgery with A SIPS OF WATER : Levothyroxine  (synthroid )   May take these medicines IF NEEDED: AirSupra  inhaler ~~Please bring your inhaler with you day of surgery   One week prior to surgery, STOP taking any Aspirin (unless otherwise instructed by your surgeon) Aleve, Naproxen, Ibuprofen , Motrin , Advil , Goody's, BC's, all herbal medications, fish oil, and non-prescription vitamins.                     Do NOT Smoke (Tobacco/Vaping) and Do Not drink alcohol for 24 hours prior to your procedure.  If you use a CPAP at night, you may bring your mask/headgear for your overnight stay.   You will be asked to remove any contacts, glasses, piercing's, hearing aid's, dentures/partials prior to surgery. Please bring cases for these items if needed.    Patients discharged the day of surgery will not be allowed to drive home, and someone needs to stay with them for 24 hours.  SURGICAL WAITING ROOM  VISITATION Patients may have no more than 2 support people in the waiting area - these visitors may rotate.   Pre-op nurse will coordinate an appropriate time for 1 ADULT support person, who may not rotate, to accompany patient in pre-op.  Children under the age of 13 must have an adult with them who is not the patient and must remain in the main waiting area with an adult.  If the patient needs to stay at the hospital during part of their recovery, the visitor guidelines for inpatient rooms apply.  Please refer to the [facility] website for the visitor guidelines for any additional information.   If you received a COVID test during your pre-op visit  it is requested that you wear a mask when out in public, stay away from anyone that may not be feeling well and notify your surgeon if you develop symptoms. If you have been in contact with anyone that has tested positive in the last 10 days please notify you surgeon.      Pre-operative CHG Bathing Instructions   You can play a key role in reducing the risk of infection after surgery. Your skin needs to be as free of germs as possible. You can reduce the number of germs on your skin by washing with CHG (chlorhexidine gluconate) soap before surgery. CHG is an antiseptic soap that kills germs and continues to kill germs even after washing.   DO NOT use if  you have an allergy to chlorhexidine/CHG or antibacterial soaps. If your skin becomes reddened or irritated, stop using the CHG and notify Pre-op nurse day of surgery              TAKE A SHOWER THE NIGHT BEFORE SURGERY AND THE DAY OF SURGERY    Please keep in mind the following:  DO NOT shave, including legs and underarms, 48 hours prior to surgery.   You may shave your face before/day of surgery.  Place clean sheets on your bed the night before surgery Use a clean washcloth (not used since being washed) for each shower. DO NOT sleep with pet's night before surgery.  CHG Shower  Instructions:  Wash your face and private area with normal soap. If you choose to wash your hair, wash first with your normal shampoo.  After you use shampoo/soap, rinse your hair and body thoroughly to remove shampoo/soap residue.  Turn the water OFF and apply half the bottle of CHG soap to a CLEAN washcloth.  Apply CHG soap ONLY FROM YOUR NECK DOWN TO YOUR TOES (washing for 3-5 minutes)  DO NOT use CHG soap on face, private areas, open wounds, or sores.  Pay special attention to the area where your surgery is being performed.  If you are having back surgery, having someone wash your back for you may be helpful. Wait 2 minutes after CHG soap is applied, then you may rinse off the CHG soap.  Pat dry with a clean towel  Put on clean pajamas    Additional instructions for the day of surgery: DO NOT APPLY any lotions,  powder,  oils,  deodorants (may use underarm deodorant) , cologne/  perfumes  or makeup Do not wear jewelry /  piercing's/  metal/  permanent jewelry must be removed prior to arrival day of surgery .  (No plastic piercing) Do not wear nail polish, gel polish, artificial nails, or any other type of covering on natural finger nails (toe nails are okay) Do not bring valuables to the hospital. [facility] is not responsible for valuables/personal belongings. Put on clean/comfortable clothes.  Please brush your teeth.  Ask your nurse before applying any prescription medications to the skin.

## 2024-01-09 NOTE — Progress Notes (Signed)
 Spoke w/ via phone for pre-op interview--- Lab needs dos----         Lab results------ COVID test -----patient states asymptomatic no test needed Arrive at ------- NPO after MN NO Solid Food.  Clear liquids from MN until--- Pre-Surgery Ensure or G2:  Med rec completed Medications to take morning of surgery ----- Diabetic medication -----  GLP1 agonist last dose: GLP1 instructions:  Patient instructed no nail polish to be worn day of surgery Patient instructed to bring photo id and insurance card day of surgery Patient aware to have Driver (ride ) / caregiver    for 24 hours after surgery -  Patient Special Instructions ----- Pre-Op special Instructions -----  Patient verbalized understanding of instructions that were given at this phone interview. Patient denies chest pain, sob, fever, cough at the interview.    Anesthesia Review:  PCP: Cardiologist :  PPM/ ICD: Device Orders: Rep Notified:  Chest x-ray : EKG : Echo : Stress test: Cardiac Cath :   Activity level:  Sleep Study/ CPAP : Fasting Blood Sugar :      / Checks Blood Sugar -- times a day:    Blood Thinner/ Instructions /Last Dose: ASA / Instructions/ Last Dose :

## 2024-01-10 ENCOUNTER — Encounter (HOSPITAL_COMMUNITY): Payer: Self-pay | Admitting: Obstetrics and Gynecology

## 2024-01-11 ENCOUNTER — Encounter (HOSPITAL_COMMUNITY)
Admission: RE | Admit: 2024-01-11 | Discharge: 2024-01-11 | Disposition: A | Discharge status: Home / Self Care | Source: Ambulatory Visit | Attending: Obstetrics and Gynecology | Admitting: Obstetrics and Gynecology

## 2024-01-11 DIAGNOSIS — N812 Incomplete uterovaginal prolapse: Secondary | ICD-10-CM | POA: Diagnosis not present

## 2024-01-11 DIAGNOSIS — Z01818 Encounter for other preprocedural examination: Secondary | ICD-10-CM | POA: Insufficient documentation

## 2024-01-11 LAB — CBC
HCT: 45.2 % (ref 36.0–46.0)
Hemoglobin: 15.2 g/dL — ABNORMAL HIGH (ref 12.0–15.0)
MCH: 31.1 pg (ref 26.0–34.0)
MCHC: 33.6 g/dL (ref 30.0–36.0)
MCV: 92.4 fL (ref 80.0–100.0)
Platelets: 313 K/uL (ref 150–400)
RBC: 4.89 MIL/uL (ref 3.87–5.11)
RDW: 12.7 % (ref 11.5–15.5)
WBC: 11 K/uL — ABNORMAL HIGH (ref 4.0–10.5)
nRBC: 0 % (ref 0.0–0.2)

## 2024-01-11 LAB — BASIC METABOLIC PANEL WITH GFR
Anion gap: 8 (ref 5–15)
BUN: 13 mg/dL (ref 8–23)
CO2: 28 mmol/L (ref 22–32)
Calcium: 10.2 mg/dL (ref 8.9–10.3)
Chloride: 102 mmol/L (ref 98–111)
Creatinine, Ser: 0.87 mg/dL (ref 0.44–1.00)
GFR, Estimated: >60 mL/min (ref >60)
Glucose, Bld: 82 mg/dL (ref 70–99)
Potassium: 4.3 mmol/L (ref 3.5–5.1)
Sodium: 138 mmol/L (ref 135–145)

## 2024-01-11 LAB — TYPE AND SCREEN
ABO/RH(D): A POS
Antibody Screen: NEGATIVE

## 2024-01-14 NOTE — Anesthesia Preprocedure Evaluation (Addendum)
 Anesthesia Evaluation  Patient identified by MRN, date of birth, ID band Patient awake    Reviewed: Allergy & Precautions, NPO status , Patient's Chart, lab work & pertinent test results  Airway Mallampati: II  TM Distance: >3 FB Neck ROM: Full    Dental no notable dental hx. (+) Upper Dentures, Lower Dentures   Pulmonary COPD, former smoker   Pulmonary exam normal breath sounds clear to auscultation       Cardiovascular hypertension, Pt. on medications Normal cardiovascular exam Rhythm:Regular Rate:Normal     Neuro/Psych  PSYCHIATRIC DISORDERS Anxiety        GI/Hepatic Neg liver ROS,GERD  Medicated and Controlled,,  Endo/Other   Hyperthyroidism   Renal/GU Lab Results      Component                Value               Date                             K                        4.3                 01/11/2024                       CREATININE               0.87                01/11/2024                GFRNONAA                 >60                 01/11/2024                CALCIUM                  10.2                01/11/2024                               Musculoskeletal  (+) Arthritis ,    Abdominal   Peds  Hematology Lab Results      Component                Value               Date                      WBC                      11.0 (H)            01/11/2024                HGB                      15.2 (H)            01/11/2024                HCT  45.2                01/11/2024                MCV                      92.4                01/11/2024                PLT                      313                 01/11/2024              Anesthesia Other Findings All: Amoxicillin, ampicillin, penicillin  Reproductive/Obstetrics                              Anesthesia Physical Anesthesia Plan  ASA: 2  Anesthesia Plan: General   Post-op Pain Management: Toradol  IV  (intra-op)*, Ofirmev  IV (intra-op)* and Precedex    Induction: Intravenous  PONV Risk Score and Plan: 4 or greater and Midazolam , Dexamethasone , Ondansetron  and Treatment may vary due to age or medical condition  Airway Management Planned: Oral ETT  Additional Equipment: None  Intra-op Plan:   Post-operative Plan: Extubation in OR  Informed Consent: I have reviewed the patients History and Physical, chart, labs and discussed the procedure including the risks, benefits and alternatives for the proposed anesthesia with the patient or authorized representative who has indicated his/her understanding and acceptance.     Dental advisory given  Plan Discussed with: CRNA and Surgeon  Anesthesia Plan Comments:          Anesthesia Quick Evaluation

## 2024-01-15 ENCOUNTER — Encounter (HOSPITAL_COMMUNITY): Payer: Self-pay | Admitting: Obstetrics and Gynecology

## 2024-01-15 ENCOUNTER — Ambulatory Visit (HOSPITAL_COMMUNITY)
Admission: RE | Admit: 2024-01-15 | Discharge: 2024-01-15 | Disposition: A | Discharge status: Home / Self Care | Source: Ambulatory Visit | Attending: Obstetrics and Gynecology | Admitting: Obstetrics and Gynecology

## 2024-01-15 ENCOUNTER — Ambulatory Visit (HOSPITAL_BASED_OUTPATIENT_CLINIC_OR_DEPARTMENT_OTHER): Payer: Self-pay | Admitting: Anesthesiology

## 2024-01-15 ENCOUNTER — Encounter (HOSPITAL_COMMUNITY)
Admission: RE | Disposition: A | Discharge status: Home / Self Care | Payer: Self-pay | Source: Ambulatory Visit | Attending: Obstetrics and Gynecology

## 2024-01-15 ENCOUNTER — Other Ambulatory Visit: Payer: Self-pay

## 2024-01-15 ENCOUNTER — Encounter: Payer: Self-pay | Admitting: Obstetrics and Gynecology

## 2024-01-15 ENCOUNTER — Ambulatory Visit (HOSPITAL_COMMUNITY): Payer: Self-pay | Admitting: Anesthesiology

## 2024-01-15 DIAGNOSIS — E0590 Thyrotoxicosis, unspecified without thyrotoxic crisis or storm: Secondary | ICD-10-CM | POA: Insufficient documentation

## 2024-01-15 DIAGNOSIS — N841 Polyp of cervix uteri: Secondary | ICD-10-CM | POA: Insufficient documentation

## 2024-01-15 DIAGNOSIS — F419 Anxiety disorder, unspecified: Secondary | ICD-10-CM | POA: Diagnosis not present

## 2024-01-15 DIAGNOSIS — N393 Stress incontinence (female) (male): Secondary | ICD-10-CM | POA: Diagnosis present

## 2024-01-15 DIAGNOSIS — Z79899 Other long term (current) drug therapy: Secondary | ICD-10-CM | POA: Insufficient documentation

## 2024-01-15 DIAGNOSIS — I10 Essential (primary) hypertension: Secondary | ICD-10-CM | POA: Insufficient documentation

## 2024-01-15 DIAGNOSIS — K219 Gastro-esophageal reflux disease without esophagitis: Secondary | ICD-10-CM | POA: Insufficient documentation

## 2024-01-15 DIAGNOSIS — J449 Chronic obstructive pulmonary disease, unspecified: Secondary | ICD-10-CM | POA: Diagnosis not present

## 2024-01-15 DIAGNOSIS — N812 Incomplete uterovaginal prolapse: Secondary | ICD-10-CM | POA: Diagnosis not present

## 2024-01-15 DIAGNOSIS — Z7985 Long-term (current) use of injectable non-insulin antidiabetic drugs: Secondary | ICD-10-CM | POA: Insufficient documentation

## 2024-01-15 DIAGNOSIS — Z01818 Encounter for other preprocedural examination: Secondary | ICD-10-CM

## 2024-01-15 HISTORY — DX: Emphysema, unspecified: J43.9

## 2024-01-15 HISTORY — DX: Overactive bladder: N32.81

## 2024-01-15 HISTORY — DX: Unspecified glaucoma: H40.9

## 2024-01-15 HISTORY — DX: Complete loss of teeth, unspecified cause, unspecified class: K08.109

## 2024-01-15 HISTORY — DX: Stress incontinence (female) (male): N39.3

## 2024-01-15 HISTORY — DX: Presence of urogenital implants: Z96.0

## 2024-01-15 HISTORY — PX: PUBOVAGINAL SLING: SHX1035

## 2024-01-15 HISTORY — DX: Gastro-esophageal reflux disease without esophagitis: K21.9

## 2024-01-15 HISTORY — DX: Incomplete uterovaginal prolapse: N81.2

## 2024-01-15 HISTORY — DX: Personal history of adenomatous and serrated colon polyps: Z86.0101

## 2024-01-15 HISTORY — PX: VAGINAL PROLAPSE REPAIR: SHX830

## 2024-01-15 HISTORY — DX: Unspecified osteoarthritis, unspecified site: M19.90

## 2024-01-15 HISTORY — PX: ANTERIOR AND POSTERIOR REPAIR: SHX5121

## 2024-01-15 HISTORY — PX: CYSTOSCOPY: SHX5120

## 2024-01-15 HISTORY — DX: Presence of dental prosthetic device (complete) (partial): Z97.2

## 2024-01-15 HISTORY — PX: HYSTERECTOMY, VAGINAL, WITH SALPINGECTOMY: SHX7588

## 2024-01-15 HISTORY — DX: Generalized anxiety disorder: F41.1

## 2024-01-15 HISTORY — DX: Other constipation: K59.09

## 2024-01-15 LAB — ABO/RH: ABO/RH(D): A POS

## 2024-01-15 SURGERY — HYSTERECTOMY, VAGINAL, WITH SALPINGECTOMY
Anesthesia: General | Site: Vagina

## 2024-01-15 MED ORDER — KETOROLAC TROMETHAMINE 30 MG/ML IJ SOLN: 30.0000 mg | Freq: Once | INTRAMUSCULAR | Status: DC | PRN

## 2024-01-15 MED ORDER — ACETAMINOPHEN 500 MG PO TABS
ORAL_TABLET | ORAL | Status: AC
  Filled 2024-01-15: qty 2

## 2024-01-15 MED ORDER — PHENYLEPHRINE 80 MCG/ML (10ML) SYRINGE FOR IV PUSH (FOR BLOOD PRESSURE SUPPORT)
PREFILLED_SYRINGE | INTRAVENOUS | Status: AC
  Filled 2024-01-15: qty 10

## 2024-01-15 MED ORDER — KETOROLAC TROMETHAMINE 30 MG/ML IJ SOLN
INTRAMUSCULAR | Status: DC | PRN
Start: 2024-01-15 — End: 2024-01-15
  Administered 2024-01-15: 15 mg via INTRAVENOUS

## 2024-01-15 MED ORDER — CHLORHEXIDINE GLUCONATE 0.12 % MT SOLN
OROMUCOSAL | Status: AC
  Filled 2024-01-15: qty 15

## 2024-01-15 MED ORDER — GABAPENTIN 300 MG PO CAPS
300.0000 mg | ORAL_CAPSULE | ORAL | Status: AC
  Administered 2024-01-15: 300 mg via ORAL

## 2024-01-15 MED ORDER — ACETAMINOPHEN 500 MG PO TABS
1000.0000 mg | ORAL_TABLET | ORAL | Status: AC
  Administered 2024-01-15: 1000 mg via ORAL

## 2024-01-15 MED ORDER — DEXMEDETOMIDINE HCL IN NACL 80 MCG/20ML IV SOLN
INTRAVENOUS | Status: DC | PRN
  Administered 2024-01-15: 8 ug via INTRAVENOUS

## 2024-01-15 MED ORDER — SCOPOLAMINE 1 MG/3DAYS TD PT72: 1.0000 | MEDICATED_PATCH | TRANSDERMAL | Status: DC

## 2024-01-15 MED ORDER — DEXAMETHASONE SODIUM PHOSPHATE 10 MG/ML IJ SOLN
INTRAMUSCULAR | Status: DC | PRN
Start: 2024-01-15 — End: 2024-01-15
  Administered 2024-01-15: 10 mg via INTRAVENOUS

## 2024-01-15 MED ORDER — PHENYLEPHRINE HCL-NACL 20-0.9 MG/250ML-% IV SOLN
INTRAVENOUS | Status: DC | PRN
  Administered 2024-01-15: 25 ug/min via INTRAVENOUS

## 2024-01-15 MED ORDER — PHENAZOPYRIDINE HCL 100 MG PO TABS
ORAL_TABLET | ORAL | Status: DC
Start: 2024-01-15 — End: 2024-01-15
  Filled 2024-01-15: qty 2

## 2024-01-15 MED ORDER — ROCURONIUM BROMIDE 10 MG/ML (PF) SYRINGE
PREFILLED_SYRINGE | INTRAVENOUS | Status: AC
  Filled 2024-01-15: qty 10

## 2024-01-15 MED ORDER — FUROSEMIDE 10 MG/ML IJ SOLN
5.0000 mg | INTRAMUSCULAR | Status: AC
  Administered 2024-01-15: 5 mg via INTRAVENOUS
  Filled 2024-01-15: qty 0.5

## 2024-01-15 MED ORDER — ACETAMINOPHEN 10 MG/ML IV SOLN
INTRAVENOUS | Status: DC | PRN
Start: 2024-01-15 — End: 2024-01-15
  Administered 2024-01-15: 1000 mg via INTRAVENOUS

## 2024-01-15 MED ORDER — LACTATED RINGERS IV SOLN: INTRAVENOUS | Status: DC

## 2024-01-15 MED ORDER — LIDOCAINE 2% (20 MG/ML) 5 ML SYRINGE
INTRAMUSCULAR | Status: DC | PRN
Start: 2024-01-15 — End: 2024-01-15
  Administered 2024-01-15: 50 mg via INTRAVENOUS
  Administered 2024-01-15: 100 mg via INTRAVENOUS

## 2024-01-15 MED ORDER — LIDOCAINE 2% (20 MG/ML) 5 ML SYRINGE
INTRAMUSCULAR | Status: AC
  Filled 2024-01-15: qty 5

## 2024-01-15 MED ORDER — METRONIDAZOLE 500 MG/100ML IV SOLN
INTRAVENOUS | Status: AC
  Filled 2024-01-15: qty 100

## 2024-01-15 MED ORDER — OXYCODONE HCL 5 MG/5ML PO SOLN: 5.0000 mg | Freq: Once | ORAL | Status: AC | PRN

## 2024-01-15 MED ORDER — HYDROMORPHONE HCL 1 MG/ML IJ SOLN: 0.2500 mg | INTRAMUSCULAR | Status: DC | PRN

## 2024-01-15 MED ORDER — ONDANSETRON HCL 4 MG/2ML IJ SOLN
INTRAMUSCULAR | Status: AC
  Filled 2024-01-15: qty 2

## 2024-01-15 MED ORDER — CHLORHEXIDINE GLUCONATE 0.12 % MT SOLN
15.0000 mL | Freq: Once | OROMUCOSAL | Status: AC
  Administered 2024-01-15: 15 mL via OROMUCOSAL

## 2024-01-15 MED ORDER — PROPOFOL 500 MG/50ML IV EMUL
INTRAVENOUS | Status: DC | PRN
  Administered 2024-01-15: 120 mg via INTRAVENOUS

## 2024-01-15 MED ORDER — DEXAMETHASONE SODIUM PHOSPHATE 10 MG/ML IJ SOLN
INTRAMUSCULAR | Status: AC
  Filled 2024-01-15: qty 1

## 2024-01-15 MED ORDER — CEFAZOLIN SODIUM-DEXTROSE 2-4 GM/100ML-% IV SOLN
INTRAVENOUS | Status: AC
  Filled 2024-01-15: qty 100

## 2024-01-15 MED ORDER — ROCURONIUM BROMIDE 10 MG/ML (PF) SYRINGE
PREFILLED_SYRINGE | INTRAVENOUS | Status: DC | PRN
  Administered 2024-01-15: 20 mg via INTRAVENOUS
  Administered 2024-01-15 (×2): 10 mg via INTRAVENOUS
  Administered 2024-01-15: 5 mg via INTRAVENOUS
  Administered 2024-01-15: 20 mg via INTRAVENOUS
  Administered 2024-01-15: 30 mg via INTRAVENOUS
  Administered 2024-01-15: 5 mg via INTRAVENOUS
  Administered 2024-01-15: 10 mg via INTRAVENOUS
  Administered 2024-01-15: 40 mg via INTRAVENOUS

## 2024-01-15 MED ORDER — CEFAZOLIN SODIUM-DEXTROSE 2-4 GM/100ML-% IV SOLN
2.0000 g | INTRAVENOUS | Status: AC
  Administered 2024-01-15: 2 g via INTRAVENOUS

## 2024-01-15 MED ORDER — ONDANSETRON HCL 4 MG/2ML IJ SOLN
INTRAMUSCULAR | Status: DC | PRN
  Administered 2024-01-15: 4 mg via INTRAVENOUS

## 2024-01-15 MED ORDER — ORAL CARE MOUTH RINSE
15.0000 mL | Freq: Once | OROMUCOSAL | Status: AC
Start: 2024-01-15 — End: 2024-01-15

## 2024-01-15 MED ORDER — PHENAZOPYRIDINE HCL 100 MG PO TABS
200.0000 mg | ORAL_TABLET | ORAL | Status: AC
  Administered 2024-01-15: 200 mg via ORAL

## 2024-01-15 MED ORDER — KETOROLAC TROMETHAMINE 30 MG/ML IJ SOLN
INTRAMUSCULAR | Status: AC
  Filled 2024-01-15: qty 1

## 2024-01-15 MED ORDER — SCOPOLAMINE 1 MG/3DAYS TD PT72
MEDICATED_PATCH | TRANSDERMAL | Status: AC
  Filled 2024-01-15: qty 1

## 2024-01-15 MED ORDER — FENTANYL CITRATE (PF) 250 MCG/5ML IJ SOLN
INTRAMUSCULAR | Status: AC
  Filled 2024-01-15: qty 5

## 2024-01-15 MED ORDER — LIDOCAINE-EPINEPHRINE 1 %-1:100000 IJ SOLN
INTRAMUSCULAR | Status: DC | PRN
  Administered 2024-01-15: 27 mL
  Administered 2024-01-15: 10 mL

## 2024-01-15 MED ORDER — SUGAMMADEX SODIUM 200 MG/2ML IV SOLN
INTRAVENOUS | Status: DC | PRN
Start: 2024-01-15 — End: 2024-01-15
  Administered 2024-01-15: 200 mg via INTRAVENOUS

## 2024-01-15 MED ORDER — 0.9 % SODIUM CHLORIDE (POUR BTL) OPTIME
TOPICAL | Status: DC | PRN
  Administered 2024-01-15: 1000 mL

## 2024-01-15 MED ORDER — OXYCODONE HCL 5 MG PO TABS
ORAL_TABLET | ORAL | Status: AC
  Filled 2024-01-15: qty 1

## 2024-01-15 MED ORDER — METRONIDAZOLE 500 MG/100ML IV SOLN
500.0000 mg | Freq: Once | INTRAVENOUS | Status: AC
  Administered 2024-01-15: 500 mg via INTRAVENOUS

## 2024-01-15 MED ORDER — MIDAZOLAM HCL 2 MG/2ML IJ SOLN
INTRAMUSCULAR | Status: DC | PRN
  Administered 2024-01-15: 2 mg via INTRAVENOUS

## 2024-01-15 MED ORDER — ONDANSETRON HCL 4 MG/2ML IJ SOLN: 4.0000 mg | Freq: Once | INTRAMUSCULAR | Status: DC | PRN

## 2024-01-15 MED ORDER — PHENYLEPHRINE 80 MCG/ML (10ML) SYRINGE FOR IV PUSH (FOR BLOOD PRESSURE SUPPORT)
PREFILLED_SYRINGE | INTRAVENOUS | Status: DC | PRN
Start: 2024-01-15 — End: 2024-01-15
  Administered 2024-01-15: 80 ug via INTRAVENOUS
  Administered 2024-01-15 (×2): 40 ug via INTRAVENOUS

## 2024-01-15 MED ORDER — FENTANYL CITRATE (PF) 250 MCG/5ML IJ SOLN
INTRAMUSCULAR | Status: DC | PRN
  Administered 2024-01-15: 50 ug via INTRAVENOUS
  Administered 2024-01-15: 100 ug via INTRAVENOUS

## 2024-01-15 MED ORDER — SUGAMMADEX SODIUM 200 MG/2ML IV SOLN
INTRAVENOUS | Status: AC
  Filled 2024-01-15: qty 2

## 2024-01-15 MED ORDER — PROPOFOL 10 MG/ML IV BOLUS
INTRAVENOUS | Status: AC
  Filled 2024-01-15: qty 20

## 2024-01-15 MED ORDER — LIDOCAINE-EPINEPHRINE 1 %-1:100000 IJ SOLN
INTRAMUSCULAR | Status: AC
Start: 2024-01-15 — End: 2024-01-15
  Filled 2024-01-15: qty 2

## 2024-01-15 MED ORDER — OXYCODONE HCL 5 MG PO TABS
5.0000 mg | ORAL_TABLET | Freq: Once | ORAL | Status: AC | PRN
  Administered 2024-01-15: 5 mg via ORAL

## 2024-01-15 MED ORDER — SODIUM CHLORIDE 0.9 % IR SOLN
Status: DC | PRN
Start: 2024-01-15 — End: 2024-01-15
  Administered 2024-01-15: 1000 mL via INTRAVESICAL

## 2024-01-15 MED ORDER — GABAPENTIN 300 MG PO CAPS
ORAL_CAPSULE | ORAL | Status: AC
  Filled 2024-01-15: qty 1

## 2024-01-15 MED ORDER — SODIUM CHLORIDE (PF) 0.9 % IJ SOLN
INTRAMUSCULAR | Status: AC
  Filled 2024-01-15: qty 10

## 2024-01-15 MED ORDER — MIDAZOLAM HCL 2 MG/2ML IJ SOLN
INTRAMUSCULAR | Status: AC
  Filled 2024-01-15: qty 2

## 2024-01-15 SURGICAL SUPPLY — 48 items
BLADE CLIPPER SENSICLIP SURGIC (BLADE) ×3 IMPLANT
BLADE SURG 15 STRL LF DISP TIS (BLADE) ×3 IMPLANT
DERMABOND ADVANCED .7 DNX12 (GAUZE/BANDAGES/DRESSINGS) IMPLANT
DEVICE CAPIO SLIM SINGLE (INSTRUMENTS) IMPLANT
DRAPE SHEET LG 3/4 BI-LAMINATE (DRAPES) ×3 IMPLANT
ELECTRODE REM PT RTRN 9FT ADLT (ELECTROSURGICAL) IMPLANT
GAUZE 4X4 16PLY ~~LOC~~+RFID DBL (SPONGE) ×3 IMPLANT
GLOVE BIOGEL PI IND STRL 6.5 (GLOVE) ×3 IMPLANT
GLOVE BIOGEL PI IND STRL 7.0 (GLOVE) ×3 IMPLANT
GLOVE ECLIPSE 6.0 STRL STRAW (GLOVE) ×3 IMPLANT
GLOVE SURG UNDER POLY LF SZ7 (GLOVE) ×3 IMPLANT
GOWN STRL REUS W/ TWL LRG LVL3 (GOWN DISPOSABLE) ×3 IMPLANT
GOWN STRL REUS W/TWL LRG LVL3 (GOWN DISPOSABLE) ×3 IMPLANT
HIBICLENS CHG 4% 4OZ BTL (MISCELLANEOUS) ×3 IMPLANT
HOLDER FOLEY CATH W/STRAP (MISCELLANEOUS) ×3 IMPLANT
KIT TURNOVER KIT B (KITS) ×3 IMPLANT
LIGASURE IMPACT 36 18CM CVD LR (INSTRUMENTS) IMPLANT
MANIFOLD NEPTUNE II (INSTRUMENTS) ×3 IMPLANT
NDL HYPO 22X1.5 SAFETY MO (MISCELLANEOUS) ×3 IMPLANT
NDL MAYO 6 CRC TAPER PT (NEEDLE) ×3 IMPLANT
NEEDLE HYPO 22X1.5 SAFETY MO (MISCELLANEOUS) ×3 IMPLANT
NEEDLE MAYO 6 CRC TAPER PT (NEEDLE) ×3 IMPLANT
NS IRRIG 1000ML POUR BTL (IV SOLUTION) ×3 IMPLANT
PACK CYSTO (CUSTOM PROCEDURE TRAY) ×3 IMPLANT
PACK VAGINAL WOMENS (CUSTOM PROCEDURE TRAY) ×3 IMPLANT
PAD OB MATERNITY 11 LF (PERSONAL CARE ITEMS) ×3 IMPLANT
RETRACTOR LONE STAR DISPOSABLE (INSTRUMENTS) ×3 IMPLANT
RETRACTOR STAY HOOK 5MM (MISCELLANEOUS) ×3 IMPLANT
SET CYSTO W/LG BORE CLAMP LF (SET/KITS/TRAYS/PACK) ×3 IMPLANT
SET IRRIG Y TYPE TUR BLADDER L (SET/KITS/TRAYS/PACK) ×3 IMPLANT
SLEEVE SCD COMPRESS KNEE MED (STOCKING) ×3 IMPLANT
SLING ADVANTAGE FIT TRANVAG (Sling) IMPLANT
SPIKE FLUID TRANSFER (MISCELLANEOUS) IMPLANT
SPONGE T-LAP 18X36 ~~LOC~~+RFID STR (SPONGE) ×3 IMPLANT
SURGIFLO W/THROMBIN 8M KIT (HEMOSTASIS) IMPLANT
SUT ABS MONO DBL WITH NDL 48IN (SUTURE) IMPLANT
SUT PDS PLUS AB 0 CT-2 (SUTURE) IMPLANT
SUT VIC AB 0 CT1 27XBRD ANBCTR (SUTURE) IMPLANT
SUT VIC AB 0 CT1 27XCR 8 STRN (SUTURE) ×3 IMPLANT
SUT VIC AB 2-0 SH 27XBRD (SUTURE) IMPLANT
SUT VICRYL 2-0 SH 8X27 (SUTURE) ×3 IMPLANT
SYR 10ML LL (SYRINGE) IMPLANT
SYR BULB EAR ULCER 3OZ GRN STR (SYRINGE) ×3 IMPLANT
TOWEL GREEN STERILE (TOWEL DISPOSABLE) ×3 IMPLANT
TOWEL GREEN STERILE FF (TOWEL DISPOSABLE) ×3 IMPLANT
TRAY FOLEY W/BAG SLVR 14FR (SET/KITS/TRAYS/PACK) ×3 IMPLANT
TRAY FOLEY W/BAG SLVR 14FR LF (SET/KITS/TRAYS/PACK) ×3 IMPLANT
UNDERPAD 30X36 HEAVY ABSORB (UNDERPADS AND DIAPERS) ×3 IMPLANT

## 2024-01-15 NOTE — Discharge Instructions (Addendum)
 POST OPERATIVE INSTRUCTIONS  General Instructions Recovery (not bed rest) will last approximately 6 weeks Walking is encouraged, but refrain from strenuous exercise/ housework/ heavy lifting. No lifting >10lbs  Nothing in the vagina- NO intercourse, tampons or douching Bathing:  Do not submerge in water (NO swimming, bath, hot tub, etc) until after your postop visit. You can shower starting the day after surgery.  No driving until you are not taking narcotic pain medicine and until your pain is well enough controlled that you can slam on the breaks or make sudden movements if needed.   Taking your medications Please take your acetaminophen  and ibuprofen  on a schedule for the first 48 hours. Take 600mg  ibuprofen , then take 500mg  acetaminophen  3 hours later, then continue to alternate ibuprofen  and acetaminophen . That way you are taking each type of medication every 6 hours. Take the prescribed narcotic (oxycodone , tramadol, etc) as needed, with a maximum being every 4 hours.  Take a stool softener daily to keep your stools soft and preventing you from straining. If you have diarrhea, you decrease your stool softener. This is explained more below. We have prescribed you Miralax .  Reasons to Call the Nurse (see last page for phone numbers) Heavy Bleeding (changing your pad every 1-2 hours) Persistent nausea/vomiting Fever (100.4 degrees or more) Incision problems (pus or other fluid coming out, redness, warmth, increased pain)  Things to Expect After Surgery Mild to Moderate pain is normal during the first day or two after surgery. If prescribed, take Ibuprofen  or Tylenol  first and use the stronger medicine for "break-through" pain. You can overlap these medicines because they work differently.   Constipation   To Prevent Constipation:  Eat a well-balanced diet including protein, grains, fresh fruit and vegetables.  Drink plenty of fluids. Walk regularly.  Depending on specific instructions  from your physician: take Miralax  daily and additionally you can add a stool softener (colace/ docusate) and fiber supplement. Continue as long as you're on pain medications.   To Treat Constipation:  If you do not have a bowel movement in 2 days after surgery, you can take 2 Tbs of Milk of Magnesia 1-2 times a day until you have a bowel movement. If diarrhea occurs, decrease the amount or stop the laxative. If no results with Milk of Magnesia, you can drink a bottle of magnesium citrate which you can purchase over the counter.  Fatigue:  This is a normal response to surgery and will improve with time.  Plan frequent rest periods throughout the day.  Gas Pain:  This is very common but can also be very painful! Drink warm liquids such as herbal teas, bouillon or soup. Walking will help you pass more gas.  Mylicon or Gas-X can be taken over the counter.  Leaking Urine:  Varying amounts of leakage may occur after surgery.  This should improve with time. Your bladder needs at least 3 months to recover from surgery. If you leak after surgery, be sure to mention this to your doctor at your post-op visit. If you were taking medications for overactive bladder prior to surgery, be sure to restart the medications immediately after surgery.  Incisions: If you have incisions on your abdomen, the skin glue will dissolve on its own over time. It is ok to gently rinse with soap and water over these incisions but do not scrub.  Catheter Approximately 50% of patients are unable to urinate after surgery and need to go home with a catheter. This allows your bladder to  rest so it can return to full function. If you go home with a catheter, the office will call to set up a voiding trial a few days after surgery. For most patients, by this visit, they are able to urinate on their own. Long term catheter use is rare.   Return to Work  As work demands and recovery times vary widely, it is hard to predict when you will want  to return to work. If you have a desk job with no strenuous physical activity, and if you would like to return sooner than generally recommended, discuss this with your provider or call our office.   Post op concerns  For non-emergent issues, please call the Urogynecology Nurse. Please leave a message and someone will contact you within one business day.  You can also send a message through MyChart.   AFTER HOURS (After 5:00 PM and on weekends):  For urgent matters that cannot wait until the next business day. Call our office [phone] and connect to the doctor on call.  Please reserve this for important issues.   **FOR ANY TRUE EMERGENCY ISSUES CALL 911 OR GO TO THE NEAREST EMERGENCY ROOM.** Please inform our office or the doctor on call of any emergency.     APPOINTMENTS: Call [phone]  Post Anesthesia Home Care Instructions  Activity: Get plenty of rest for the remainder of the day. A responsible adult should stay with you for 24 hours following the procedure.  For the next 24 hours, DO NOT: -Drive a car -Advertising copywriter -Drink alcoholic beverages -Take any medication unless instructed by your physician -Make any legal decisions or sign important papers.  Meals: Start with liquid foods such as gelatin or soup. Progress to regular foods as tolerated. Avoid greasy, spicy, heavy foods. If nausea and/or vomiting occur, drink only clear liquids until the nausea and/or vomiting subsides. Call your physician if vomiting continues.  Special Instructions/Symptoms: Your throat may feel dry or sore from the anesthesia or the breathing tube placed in your throat during surgery. If this causes discomfort, gargle with warm salt water. The discomfort should disappear within 24 hours.  If you had a scopolamine patch placed behind your ear for the management of post- operative nausea and/or vomiting:  1. The medication in the patch is effective for 72 hours, after which it should be  removed.  Wrap patch in a tissue and discard in the trash. Wash hands thoroughly with soap and water. 2. You may remove the patch earlier than 72 hours if you experience unpleasant side effects which may include dry mouth, dizziness or visual disturbances. 3. Avoid touching the patch. Wash your hands with soap and water after contact with the patch.  Call your surgeon if you experience:   1.  Fever over 101.0. 2.  Inability to urinate. 3.  Nausea and/or vomiting. 4.  Extreme swelling or bruising at the surgical site. 5.  Continued bleeding from the incision. 6.  Increased pain, redness or drainage from the incision. 7.  Problems related to your pain medication. 8. Any change in color, movement and/or sensation 9. Any problems and/or concerns

## 2024-01-15 NOTE — Anesthesia Procedure Notes (Addendum)
 Procedure Name: Intubation Date/Time: 01/15/2024 7:40 AM  Performed by: Viviana Almarie DASEN, CRNAPre-anesthesia Checklist: Patient identified, Emergency Drugs available, Suction available and Patient being monitored Patient Re-evaluated:Patient Re-evaluated prior to induction Oxygen Delivery Method: Circle system utilized Preoxygenation: Pre-oxygenation with 100% oxygen Induction Type: IV induction Ventilation: Mask ventilation without difficulty Laryngoscope Size: Mac and 3 Grade View: Grade I Tube type: Oral Tube size: 7.0 mm Number of attempts: 1 Airway Equipment and Method: Stylet and Bite block Placement Confirmation: ETT inserted through vocal cords under direct vision, positive ETCO2 and breath sounds checked- equal and bilateral Secured at: 21 cm Tube secured with: Tape Dental Injury: Teeth and Oropharynx as per pre-operative assessment

## 2024-01-15 NOTE — Interval H&P Note (Signed)
 History and Physical Interval Note:  01/15/2024 7:13 AM  Dana Dickerson  has presented today for surgery, with the diagnosis of Uterovaginal prolapse incomplete and Stress urinary incontinence.  The various methods of treatment have been discussed with the patient and family. After consideration of risks, benefits and other options for treatment, the patient has consented to  Procedure(s) with comments: HYSTERECTOMY, VAGINAL, WITH SALPINGECTOMY (N/A) - Possible BSO SUSPENSION, VAGINAL VAULT (N/A) ANTERIOR (CYSTOCELE) AND POSTERIOR REPAIR (RECTOCELE) (N/A) CYSTOSCOPY (N/A) as a surgical intervention.  The patient's history has been reviewed, patient examined, no change in status, stable for surgery.  I have reviewed the patient's chart and labs.  Questions were answered to the patient's satisfaction.     [name]

## 2024-01-15 NOTE — Op Note (Signed)
 Operative Note  Preoperative Diagnosis: anterior vaginal prolapse, posterior vaginal prolapse, uterovaginal prolapse, incomplete, and stress urinary incontinence  Postoperative Diagnosis: same  Procedures performed:  Total vaginal hysterectomy with bilateral salpingo-oophorectomy, uterosacral ligament suspension, anterior and posterior repair with perineorrhaphy, midurethral sling, cystoscopy  Implants:  Implant Name Type Inv. Item Serial No. Manufacturer Lot No. LRB No. Used Action  VALORIE CAROLENE BLUSH [facility] - [id number] Sling SLING ADVANTAGE FIT [facility]  [employer] [id number] N/A 1 Implanted    Attending Surgeon: Rosaline Caper, MD  Assistant: Jorene Moats, PA  Anesthesia: General endotracheal  Findings: 1. On vaginal exam, stage II prolapse present  2. On cystoscopy, normal bladder and urethral mucosa without injury or lesion. Brisk bilateral ureteral efflux present throughout the case.    Specimens:  ID Type Source Tests Collected by Time Destination  1 : uterus, cervix, bilateral fallopian tubes and ovaries Tissue PATH Gyn benign resection SURGICAL PATHOLOGY Caper Rosaline SAILOR, MD 01/15/2024 [phone]     Estimated blood loss: 120 mL  IV fluids: 1000 mL  Urine output: 400 mL  Complications: none  Procedure in Detail:  After informed consent was obtained, the patient was taken to the operating room where she was placed under anesthesia.  She was then placed in the dorsal lithotomy position with Allen stirrups and prepped and draped in the usual sterile fashion.  Care was taken to avoid hyperflexion or hyperextension of her lower extremities.    A self-retaining retractor was placed, and a foley catheter was placed. The cervix was grasped with two tenacula.  The cervix was injected circumferentially with 1% lidocaine  with epinephrine . A 10 blade was used to incise circumferentially around the cervix.  The posterior vagina was grasped with a Kocher clamp. The  Mayo scissors were used to enter the posterior cul-de-sac. Palpation confirmed peritoneal entry and no adhesions. The posterior peritoneum was affixed to the vaginal cuff with 0-Vicryl (this suture was used throughout unless otherwise specified) at the midline. A right angle retractor was placed through the posterior colpotomy. A Heaney clamp was then used to clamp the uterosacral ligaments on each side. These were cut and suture ligated using 0-Vicryl in a Heaney fashion, and tagged with hemostats. Anteriorly, the bladder was dissected off the pubocervical fascia using Metzenbaum scissors. A Deaver retractor was placed anteriorly to protect the bladder. The anterior peritoneal reflection was then identified, tented up and incised with Metzenbaum scissors to create an anterior colpotomy. Palpation confirmed peritoneal entry and no adhesions. The Deaver was placed anteriorly to protect the bladder. The cardinal ligaments were clamped, ligated and cut with the ligasure in a similar fashion on each side. The uterine arteries were also clamped and ligated with the ligasure. The cornua were clamped, cut, free-tied and suture ligated. The uterus and cervix were handed off the field.  Inspection of the pedicles revealed excellent hemostasis.  The right fallopian tube and ovary was grapsed with a Babcock clamp. The IP was then clamped and ligated with the ligasure to remove the ovary. The fallopian tube remnant was identified and also clamped, caterized and cut to remove. This was repeated on the left side.  For the uterosacral ligament suspension (USLS), the bowel was packed away with a moistened lap pad. The posterior cuff edge was grasped with an Allis clamp.  The right and left uterosacral ligaments were identified visually and digitally. Two stitches of 0 PDS was placed through each uterosacral ligament towards its insertion site at the sacrum. These were tagged.. The  packing was removed.  A 70-degree cystoscope was  introduced, and 360-degree inspection revealed no trauma in the bladder, with bilateral ureteral efflux with tension on the uterosacral sutures.  The bladder was drained and the cystoscope was removed.  The Foley catheter was reinserted.    For the anterior repair, two Allis clamps were placed along the midline of the anterior vaginal wall.  1% lidocaine  with epinephrine  was injected into the vaginal mucosa.  A 15 blade scalpel was used to incise the vaginal mucosa in the midline. Allis clamps were placed along this incision and Metzenbaum scissors were used to sharply dissect the epithelium off of the vesicovaginal septum bilaterally to the level of the pubic rami. Anterior plication of the vesicovaginal septum was then performed using 2-0 Vicryl. The vaginal mucosal edges were trimmed and the incision reapproximated with 2-0 Vicryl in a running fashion. Hemostasis was noted.   The sling was performed next. The mid urethral area was located on the anterior vaginal wall.  Two Allis clamps were placed at the level of the midurethra. 1% lidocaine  with epinephrine  was injected into the vaginal mucosa. A vertical incision was made between the two clamps using a 15-blade scalpel.  Using sharp dissection, Metzenbaum scissors were used to make a periurethral tunnel from the vaginal incision towards the pubic rami bilaterally for the future sling tracts. The bladder was ensured to be empty. The trocar and attached sling were introduced into the right side of the periurethral vaginal incision, just inferior to the pubic symphysis on the right side. The trocar was guided through the endopelvic fascia and directly vertically.  While hugging the cephalad surface of the pubic bone, the trocar was guided out through the abdomen 2 fingerbreadths lateral to midline at the level of the pubic symphysis on the ipsilateral side. The trocar was placed on the left side in a similar fashion.  A 70-degree cystoscope was introduced, and  360-degree inspection revealed no trauma or trocars in the bladder, with brisk bilateral ureteral efflux.  The bladder was drained and the cystoscope was removed.  The Foley catheter was reinserted.  The sling was brought to lie beneath the mid-urethra.  A needle driver was placed behind the sling to ensure no tension.   The plastic sheath was removed from the sling and the distal ends of the sling were trimmed just below the level of the skin incisions.  Tension-free positioning of the sling was confirmed. Vaginal inspection revealed no vaginotomy or sling perforations of the mucosa.  The vaginal mucosal edges were reapproximated using 2-0 Vicryl.  The suprapubic sling incisions were closed with Dermabond.   The uterosacral stitches were then attached to the posterior and anterior edges of the vaginal cuff on the ipsilateral sides, in a through and through fashion, using a free needle. Figure of eight sutures of 0-Vicryl were placed through the vaginal cuff and tied down. The lateral uterosacral stitches were then tied down with good apical support noted. The Foley catheter was removed. A 70-degree cystoscope was introduced, and 360-degree inspection revealed no trauma in the bladder, with bilateral ureteral efflux. The cystoscope was removed. The medial uterosacral sutures were then tied down. Cystoscopy was repeated and brisk bilateral ureteral efflux was noted. The bladder was drained and the cystoscope was removed.  The Foley catheter was reinserted.  Attention was then turned to the posterior vagina.  Two Allis clamps were in the midline of the posterior vaginal wall defect, and two allis clamps were placed a  the introitus.  1% lidocaine  with epinephrine  was injected into the vaginal mucosa. A vertical incision was made between these clamps with a 15 blade scalpel and a diamond shaped area of epithelium was cut at the perineum.  The rectovaginal septum was then dissected off the vaginal mucosa bilaterally.  The rectovaginal septum was then plicated with vertical mattress sutures of 2-0 Vicryl.  After placement of the first plication stitch two fingers were inserted into the vaginal to confirm adequate caliber.  The last distal stitch incorporated the perineal body in a U stitch fashion.  The bulbocavernosus muscles were reapproximated using three interrupted stitches of 2-0 Vicryl and the hymenal ring was redeveloped. After plication, the excess vaginal mucosa was trimmed and the vaginal mucosa was reapproximated using 2-0 Vicryl sutures. The perineal body was then reapproximated with two interrupted 0-vicryl sutures. The perineal skin was then closed with a 2-0 vicryl in a subcutaneous and subcuticular fashion.  The vagina was copiously irrigated.  Hemostasis was noted. A rectal examination was normal and confirmed no sutures within the rectum.  The patient tolerated the procedure well.  She was awakened from anesthesia and transferred to the recovery room in stable condition. All counts were correct x 2.    [name], MD

## 2024-01-15 NOTE — Transfer of Care (Signed)
 Immediate Anesthesia Transfer of Care Note  Patient: KIKUYE KORENEK  Procedure(s) Performed: HYSTERECTOMY, VAGINAL, WITH SALPINGo-oophorectomy (Pelvis) UTEROSACRAL LIGAMENT SUSPENSION (Vagina ) ANTERIOR (CYSTOCELE) AND POSTERIOR REPAIR (RECTOCELE) WITH PERINEORRHAPHY (Vagina ) CYSTOSCOPY (Bladder) MIDURETHRAL SLING (Vagina )  Patient Location: PACU  Anesthesia Type:General  Level of Consciousness: awake, alert , oriented, and patient cooperative  Airway & Oxygen Therapy: Patient Spontanous Breathing  Post-op Assessment: Report given to RN, Post -op Vital signs reviewed and stable, Patient moving all extremities X 4, and Patient able to stick tongue midline  Post vital signs: Reviewed and stable  Last Vitals:  Vitals Value Taken Time  BP 135/70 01/15/24 11:03  Temp 99.1   Pulse 79 01/15/24 11:05  Resp 16 01/15/24 11:05  SpO2 94 % 01/15/24 11:05  Vitals shown include unfiled device data.  Last Pain:  Vitals:   01/15/24 0615  TempSrc: Oral  PainSc: 0-No pain      Patients Stated Pain Goal: 6 (01/15/24 0615)  Complications: No notable events documented.

## 2024-01-15 NOTE — Anesthesia Postprocedure Evaluation (Signed)
 Anesthesia Post Note  Patient: Dana Dickerson  Procedure(s) Performed: HYSTERECTOMY, VAGINAL, WITH SALPINGo-oophorectomy (Pelvis) UTEROSACRAL LIGAMENT SUSPENSION (Vagina ) ANTERIOR (CYSTOCELE) AND POSTERIOR REPAIR (RECTOCELE) WITH PERINEORRHAPHY (Vagina ) CYSTOSCOPY (Bladder) MIDURETHRAL SLING (Vagina )     Patient location during evaluation: PACU Anesthesia Type: General Level of consciousness: awake and alert Pain management: pain level controlled Vital Signs Assessment: post-procedure vital signs reviewed and stable Respiratory status: spontaneous breathing, nonlabored ventilation, respiratory function stable and patient connected to nasal cannula oxygen Cardiovascular status: blood pressure returned to baseline and stable Postop Assessment: no apparent nausea or vomiting Anesthetic complications: no   No notable events documented.  Last Vitals:  Vitals:   01/15/24 1103 01/15/24 1115  BP: 135/70 131/80  Pulse: 83 68  Resp: 16 11  Temp: 37.3 C   SpO2: 94% 94%    Last Pain:  Vitals:   01/15/24 1115  TempSrc:   PainSc: 0-No pain                 Garnette DELENA Gab

## 2024-01-16 ENCOUNTER — Telehealth: Payer: Self-pay | Admitting: Obstetrics and Gynecology

## 2024-01-16 ENCOUNTER — Encounter (HOSPITAL_COMMUNITY): Payer: Self-pay | Admitting: Obstetrics and Gynecology

## 2024-01-16 LAB — SURGICAL PATHOLOGY

## 2024-01-16 NOTE — Telephone Encounter (Signed)
 Dana Dickerson underwent Total vaginal hysterectomy with bilateral salpingo-oophorectomy, uterosacral ligament suspension, anterior and posterior repair with perineorrhaphy, midurethral sling, cystoscopy on 01/15/24.   She failed her voiding trial.  was backfilled into the bladder She was unable to void  She was discharged with a catheter. Please call her for a routine post op check and to schedule a voiding trial by Friday 7/25. Thanks!  [name], MD

## 2024-01-18 ENCOUNTER — Ambulatory Visit

## 2024-01-18 NOTE — Patient Instructions (Signed)
  Please drink lots of water and try to expel as much urine as you are inputting. If you are unable to urinate, give the office a call before 2 pm.     Please keep all future appointments and if you have any questions or concerns please feel free to contact our office at [phone].

## 2024-01-18 NOTE — Progress Notes (Signed)
 Dana Dickerson underwent HYSTERECTOMY, VAGINAL, WITH SALPINGo-oophorectomy, Uterosacral Ligament Suspension, Anterior (cystocele) And Posterior Repair (rectocele) With Perineorrhaphy, Cystoscopy, and Midurethral Sling on 01/15/2024  She presents for a voiding trial.   Patient was identified with 2 identifiers.  The patient states she does not have any concerns with the foley placed.  250 mL of NS was instilled into the bladder via a catheter.  The catheter was removed and patient was instructed to void into the urinary hat.  She voided 210 mL.  The post void residual measured by bladder scan was 13 mL.  She did pass the voiding trial.  The patient was not sent home with a catheter.    The patient received aftercare instructions and will follow up as scheduled.

## 2024-01-19 ENCOUNTER — Encounter: Payer: Self-pay | Admitting: Obstetrics and Gynecology

## 2024-01-22 NOTE — Telephone Encounter (Signed)
 SABRA

## 2024-02-26 ENCOUNTER — Encounter: Payer: Self-pay | Admitting: Obstetrics and Gynecology

## 2024-02-26 ENCOUNTER — Ambulatory Visit (INDEPENDENT_AMBULATORY_CARE_PROVIDER_SITE_OTHER): Admitting: Obstetrics and Gynecology

## 2024-02-26 VITALS — BP 150/88 | HR 70

## 2024-02-26 DIAGNOSIS — Z9889 Other specified postprocedural states: Secondary | ICD-10-CM

## 2024-02-26 DIAGNOSIS — Z48816 Encounter for surgical aftercare following surgery on the genitourinary system: Secondary | ICD-10-CM

## 2024-02-26 NOTE — Progress Notes (Signed)
  Urogynecology  Date of Visit: 02/26/2024  History of Present Illness: Ms. Dana Dickerson is a 63 y.o. female scheduled today for a post-operative visit.   Surgery: s/p Total vaginal hysterectomy with bilateral salpingo-oophorectomy, uterosacral ligament suspension, anterior and posterior repair with perineorrhaphy, midurethral sling, cystoscopy on 01/15/24  She did not pass her postoperative void trial.   Postoperative course has been uncomplicated.   Today she reports still has a small tan colored discharge. Has noticed a little bit of rectal pain. Has been using estrogen cream twice a week.   UTI in the last 6 weeks? No  Pain? Occasional in rectal area She has not returned to her normal activity (except for postop restrictions) Vaginal bulge? No  Stress incontinence: No  Urgency/frequency: No  Urge incontinence: No  Voiding dysfunction: No  Bowel issues: No - did have to take some milk of magnesia at some point but no constipation now.   Subjective Success: Do you usually have a bulge or something falling out that you can see or feel in the vaginal area? No  Retreatment Success: Any retreatment with surgery or pessary for any compartment? No   Pathology results: UTERUS AND CERVIX, WITH BILATERAL FALLOPIAN TUBES AND OVARIES,  HYSTERECTOMY:  - Cervix: Benign endocervical polyp  - Endometrium: Benign endometrium with polyp-like changes  - Myometrium: Unremarkable  - Complete cross-sections of bilateral benign fallopian tubes.  Ovaries: The larger ovary weighs 2.5 g, measures 2.9 x 1.8 x 1.2 cm, and  consists of a pink-tan and intact external surface.  Sectioning reveals  an underlying tan parenchyma, without distinct lesions.  The smaller  ovary weighs 2.2 g, measures 2.6 x 1.8 x 1.2 cm, and consists of a  pink-tan and intact external surface.  Sectioning reveals an underlying  tan parenchyma, without distinct lesions.   Medications: She has a current medication list which  includes the following prescription(s): acetaminophen , prevagen, breztri aerosphere, bupropion , clobetasol propionate, cyanocobalamin, doxepin, ibuprofen , lisinopril , melatonin, methimazole, multivitamin, hair skin nails, omeprazole, polyethylene glycol 3350 , zepbound, triamcinolone ointment, acetaminophen , estradiol , gabapentin , ibuprofen , and metronidazole .   Allergies: Patient is allergic to codeine, amoxicillin, ampicillin, and penicillins.   Physical Exam: BP (!) 150/88   Pulse 70    Suprapubic Incisions: healing well.  Pelvic Examination: perineal incision well healed, some tenderness at suture at introitus. Vagina: Incisions healing well. Sutures are present at incision line and there is not granulation tissue. No tenderness along the anterior or posterior vagina. No apical tenderness. No pelvic masses. No visible or palpable mesh.  POP-Q: POP-Q  -3                                            Aa   -3                                           Ba  -8                                              C   2  Gh  4                                            Pb  8                                            tvl   -3                                            Ap  -3                                            Bp                                                 D    ---------------------------------------------------------  Assessment and Plan:  1. Post-operative state     - Pathology results were reviewed with the patient today and she verbalized understanding that the results were benign.  - Can resume regular activity including exercise. Discussed avoidance of heavy lifting and straining long term to reduce the risk of recurrence. Wait an additional 6 weeks for intercourse.  - Will reexamine in a month to check for improvement of rectal discomfort. Suspect if is from perineal sutures. Will plan for rectal exam next visit.   Return in  about 1 month (around 03/27/2024).  Dana LOISE Caper, MD

## 2024-02-27 ENCOUNTER — Encounter: Payer: Self-pay | Admitting: Obstetrics and Gynecology

## 2024-03-18 ENCOUNTER — Encounter: Payer: Self-pay | Admitting: Obstetrics and Gynecology

## 2024-03-20 ENCOUNTER — Emergency Department

## 2024-03-20 ENCOUNTER — Emergency Department
Admission: EM | Admit: 2024-03-20 | Discharge: 2024-03-20 | Disposition: A | Discharge status: Home / Self Care | Attending: Emergency Medicine | Admitting: Emergency Medicine

## 2024-03-20 ENCOUNTER — Other Ambulatory Visit: Payer: Self-pay

## 2024-03-20 DIAGNOSIS — S42202A Unspecified fracture of upper end of left humerus, initial encounter for closed fracture: Secondary | ICD-10-CM | POA: Diagnosis not present

## 2024-03-20 DIAGNOSIS — W19XXXA Unspecified fall, initial encounter: Secondary | ICD-10-CM | POA: Insufficient documentation

## 2024-03-20 DIAGNOSIS — S4992XA Unspecified injury of left shoulder and upper arm, initial encounter: Secondary | ICD-10-CM | POA: Diagnosis present

## 2024-03-20 DIAGNOSIS — I10 Essential (primary) hypertension: Secondary | ICD-10-CM | POA: Insufficient documentation

## 2024-03-20 LAB — COMPREHENSIVE METABOLIC PANEL WITH GFR
ALT: 18 U/L (ref 0–44)
AST: 23 U/L (ref 15–41)
Albumin: 3.9 g/dL (ref 3.5–5.0)
Alkaline Phosphatase: 40 U/L (ref 38–126)
Anion gap: 12 (ref 5–15)
BUN: 17 mg/dL (ref 8–23)
CO2: 22 mmol/L (ref 22–32)
Calcium: 9.5 mg/dL (ref 8.9–10.3)
Chloride: 106 mmol/L (ref 98–111)
Creatinine, Ser: 0.93 mg/dL (ref 0.44–1.00)
GFR, Estimated: >60 mL/min (ref >60)
Glucose, Bld: 82 mg/dL (ref 70–99)
Potassium: 3.9 mmol/L (ref 3.5–5.1)
Sodium: 140 mmol/L (ref 135–145)
Total Bilirubin: 0.4 mg/dL (ref 0.0–1.2)
Total Protein: 6.7 g/dL (ref 6.5–8.1)

## 2024-03-20 LAB — CBC
HCT: 43.4 % (ref 36.0–46.0)
Hemoglobin: 14.5 g/dL (ref 12.0–15.0)
MCH: 30.7 pg (ref 26.0–34.0)
MCHC: 33.4 g/dL (ref 30.0–36.0)
MCV: 91.8 fL (ref 80.0–100.0)
Platelets: 298 K/uL (ref 150–400)
RBC: 4.73 MIL/uL (ref 3.87–5.11)
RDW: 12.4 % (ref 11.5–15.5)
WBC: 11.4 K/uL — ABNORMAL HIGH (ref 4.0–10.5)
nRBC: 0 % (ref 0.0–0.2)

## 2024-03-20 MED ORDER — FENTANYL CITRATE PF 50 MCG/ML IJ SOSY
100.0000 ug | PREFILLED_SYRINGE | Freq: Once | INTRAMUSCULAR | Status: AC
  Administered 2024-03-20: 100 ug via INTRAVENOUS
  Filled 2024-03-20: qty 2

## 2024-03-20 MED ORDER — OXYCODONE-ACETAMINOPHEN 5-325 MG PO TABS: 1.0000 | ORAL_TABLET | Freq: Three times a day (TID) | ORAL | 0 refills | Status: DC | PRN

## 2024-03-20 NOTE — ED Triage Notes (Signed)
 Pt to ED for fall, tripped over wires in house. Denies hitting head. Laceration to left knee. Pt states cannot move left shoulder or elbow. Tearful in triage

## 2024-03-20 NOTE — ED Provider Notes (Signed)
 [facility] Provider Note    Event Date/Time   First MD Initiated Contact with Patient 03/20/24 1428     (approximate)   History   Fall   HPI  Dana Dickerson is a 63 y.o. female with a history of hypertension, arthritis, presents after mechanical fall with left shoulder injury.  No head injury no other complaints     Physical Exam   Triage Vital Signs: ED Triage Vitals  Encounter Vitals Group     BP 03/20/24 1403 (!) 180/106     Girls Systolic BP Percentile --      Girls Diastolic BP Percentile --      Boys Systolic BP Percentile --      Boys Diastolic BP Percentile --      Pulse Rate 03/20/24 1403 85     Resp 03/20/24 1403 20     Temp 03/20/24 1403 97.8 F (36.6 C)     Temp src --      SpO2 03/20/24 1403 98 %     Weight 03/20/24 1406 63.5 kg (140 lb)     Height 03/20/24 1406 1.651 m (5' 5)     Head Circumference --      Peak Flow --      Pain Score 03/20/24 1405 8     Pain Loc --      Pain Education --      Exclude from Growth Chart --     Most recent vital signs: Vitals:   03/20/24 1403  BP: (!) 180/106  Pulse: 85  Resp: 20  Temp: 97.8 F (36.6 C)  SpO2: 98%     General: Awake, no distress.  CV:  Good peripheral perfusion.  Resp:  Normal effort.  Abd:  No distention.  Other:  Left arm warm and well-perfused, deformity proximal humerus suspect fracture   ED Results / Procedures / Treatments   Labs (all labs ordered are listed, but only abnormal results are displayed) Labs Reviewed  CBC - Abnormal; Notable for the following components:      Result Value   WBC 11.4 (*)    All other components within normal limits  COMPREHENSIVE METABOLIC PANEL WITH GFR     EKG     RADIOLOGY Shoulder x-ray viewed interpret by me, proximal humerus fracture    PROCEDURES:  Critical Care performed:   Procedures   MEDICATIONS ORDERED IN ED: Medications  fentaNYL  (SUBLIMAZE ) injection 100 mcg (100 mcg Intravenous Given  03/20/24 1456)     IMPRESSION / MDM / ASSESSMENT AND PLAN / ED COURSE  I reviewed the triage vital signs and the nursing notes. Patient's presentation is most consistent with acute complicated illness / injury requiring diagnostic workup.  Patient presents after fall with left shoulder arm injury, exam concerning for proximal humerus fracture, confirmed by x-ray, will treat with IV fentanyl , discussed with Dr. Shereen MATSU of orthopedics, he will see in his office, we will place shoulder immobilizer        FINAL CLINICAL IMPRESSION(S) / ED DIAGNOSES   Final diagnoses:  Fall, initial encounter  Closed fracture of proximal end of left humerus, unspecified fracture morphology, initial encounter     Rx / DC Orders   ED Discharge Orders          Ordered    oxyCODONE -acetaminophen  (PERCOCET) 5-325 MG tablet  Every 8 hours PRN        03/20/24 1530  Note:  This document was prepared using Dragon voice recognition software and may include unintentional dictation errors.   Arlander Charleston, MD 03/20/24 [phone]

## 2024-03-26 ENCOUNTER — Ambulatory Visit (INDEPENDENT_AMBULATORY_CARE_PROVIDER_SITE_OTHER): Admitting: Obstetrics and Gynecology

## 2024-03-26 ENCOUNTER — Encounter: Payer: Self-pay | Admitting: Obstetrics and Gynecology

## 2024-03-26 VITALS — BP 134/84 | HR 76

## 2024-03-26 DIAGNOSIS — Z48816 Encounter for surgical aftercare following surgery on the genitourinary system: Secondary | ICD-10-CM

## 2024-03-26 DIAGNOSIS — Z9889 Other specified postprocedural states: Secondary | ICD-10-CM

## 2024-03-26 NOTE — Progress Notes (Signed)
 [facility]  Date of Visit: 03/26/2024  History of Present Illness: Ms. Brosh is a 63 y.o. female scheduled today for a post-operative visit.   Surgery: s/p Total vaginal hysterectomy with bilateral salpingo-oophorectomy, uterosacral ligament suspension, anterior and posterior repair with perineorrhaphy, midurethral sling, cystoscopy on 01/15/24  She did not pass her postoperative void trial.   Postoperative course has been uncomplicated.   She broke her arm last week after a fall. She is on pain medication for that.   Pain has improved in the rectal area. It still happens every once in a while. Still has some throbbing. No longer taking gabapentin . No bladder accidents, feels like she is emptying well. . No bowel issues or constipation. Not using estrace  cream anymore.   Medications: She has a current medication list which includes the following prescription(s): acetaminophen , acetaminophen , prevagen, breztri aerosphere, bupropion , clobetasol propionate, cyanocobalamin, doxepin, estradiol , gabapentin , hydrocodone-acetaminophen , ibuprofen , ibuprofen , lisinopril , melatonin, methimazole, multivitamin, hair skin nails, omeprazole, polyethylene glycol 3350 , zepbound, triamcinolone ointment, and oxycodone -acetaminophen .   Allergies: Patient is allergic to codeine, amoxicillin, ampicillin, and penicillins.   Physical Exam: BP 134/84   Pulse 76    Suprapubic Incisions: healing well.  Pelvic Examination: Incisions healing well. Sutures are present at the cuff, healing well and there is not granulation tissue. No tenderness along the anterior or posterior vagina. No apical tenderness. No pelvic masses. No visible or palpable mesh.  Rectal exam- good sphincter tone, no foreign bodies, no masses  ---------------------------------------------------------  Assessment and Plan:  1. Post-operative state    - Well healed post operatively - Can resume regular activity including exercise  as able with broken arm. Discussed avoidance of heavy lifting and straining long term to reduce the risk of recurrence. Wait an additional 2 weeks for intercourse.   Follow up as needed  [name], MD

## 2024-04-14 ENCOUNTER — Other Ambulatory Visit (HOSPITAL_COMMUNITY): Payer: Self-pay | Admitting: Obstetrics & Gynecology

## 2024-04-14 DIAGNOSIS — Z1231 Encounter for screening mammogram for malignant neoplasm of breast: Secondary | ICD-10-CM

## 2024-04-29 ENCOUNTER — Ambulatory Visit
Admission: RE | Admit: 2024-04-29 | Discharge: 2024-04-29 | Disposition: A | Discharge status: Home / Self Care | Source: Ambulatory Visit | Attending: Acute Care | Admitting: Acute Care

## 2024-04-29 DIAGNOSIS — Z87891 Personal history of nicotine dependence: Secondary | ICD-10-CM

## 2024-04-29 DIAGNOSIS — Z122 Encounter for screening for malignant neoplasm of respiratory organs: Secondary | ICD-10-CM

## 2024-05-05 ENCOUNTER — Other Ambulatory Visit: Payer: Self-pay

## 2024-05-05 ENCOUNTER — Telehealth: Payer: Self-pay

## 2024-05-05 DIAGNOSIS — Z87891 Personal history of nicotine dependence: Secondary | ICD-10-CM

## 2024-05-05 DIAGNOSIS — Z122 Encounter for screening for malignant neoplasm of respiratory organs: Secondary | ICD-10-CM

## 2024-05-05 NOTE — Telephone Encounter (Signed)
 Error

## 2024-05-12 ENCOUNTER — Ambulatory Visit (HOSPITAL_COMMUNITY)
Admission: RE | Admit: 2024-05-12 | Discharge: 2024-05-12 | Disposition: A | Discharge status: Home / Self Care | Source: Ambulatory Visit | Attending: Obstetrics & Gynecology | Admitting: Obstetrics & Gynecology

## 2024-05-12 DIAGNOSIS — Z1231 Encounter for screening mammogram for malignant neoplasm of breast: Secondary | ICD-10-CM | POA: Diagnosis present

## 2024-05-15 ENCOUNTER — Ambulatory Visit: Payer: Self-pay | Admitting: Obstetrics & Gynecology

## 2024-07-07 ENCOUNTER — Encounter: Payer: Self-pay | Admitting: *Deleted
